# Patient Record
Sex: Male | Born: 1965
Health system: Southern US, Community
[De-identification: ages and names within clinical notes are randomized; demographics above are authoritative.]

## PROBLEM LIST (undated history)

## (undated) HISTORY — PX: CHOLECYSTECTOMY: SHX55

## (undated) HISTORY — PX: OTHER SURGICAL HISTORY: SHX169

## (undated) HISTORY — PX: TONSILLECTOMY: SUR1361

---

## 2018-08-31 ENCOUNTER — Emergency Department (HOSPITAL_COMMUNITY)
Admission: EM | Admit: 2018-08-31 | Discharge: 2018-08-31 | Disposition: A | Discharge status: Home / Self Care | Payer: PRIVATE HEALTH INSURANCE | Attending: Emergency Medicine | Admitting: Emergency Medicine

## 2018-08-31 ENCOUNTER — Emergency Department (HOSPITAL_COMMUNITY): Payer: PRIVATE HEALTH INSURANCE

## 2018-08-31 DIAGNOSIS — Y9389 Activity, other specified: Secondary | ICD-10-CM | POA: Insufficient documentation

## 2018-08-31 DIAGNOSIS — W108XXA Fall (on) (from) other stairs and steps, initial encounter: Secondary | ICD-10-CM | POA: Insufficient documentation

## 2018-08-31 DIAGNOSIS — Y929 Unspecified place or not applicable: Secondary | ICD-10-CM | POA: Insufficient documentation

## 2018-08-31 DIAGNOSIS — Y999 Unspecified external cause status: Secondary | ICD-10-CM | POA: Insufficient documentation

## 2018-08-31 DIAGNOSIS — W19XXXA Unspecified fall, initial encounter: Secondary | ICD-10-CM

## 2018-08-31 DIAGNOSIS — M25561 Pain in right knee: Secondary | ICD-10-CM | POA: Diagnosis not present

## 2018-08-31 MED ORDER — INDOMETHACIN 50 MG PO CAPS: 50.0000 mg | ORAL_CAPSULE | Freq: Every day | ORAL | 0 refills | Status: DC | PRN

## 2018-08-31 MED ORDER — IBUPROFEN 800 MG PO TABS
800.0000 mg | ORAL_TABLET | Freq: Once | ORAL | Status: AC
  Administered 2018-08-31: 800 mg via ORAL
  Filled 2018-08-31: qty 1

## 2018-08-31 NOTE — ED Provider Notes (Signed)
[facility] DEPT Provider Note   CSN: [id number] Arrival date & time: 08/31/18  0248     History   Chief Complaint Chief Complaint  Patient presents with  . Fall    HPI Ryan Collier is a 52 y.o. male presenting for evaluation after fall.  Patient states he was going down the stairs when he slipped, and his right leg got pinned between the walls and the stairs.  He reports acute onset of right knee pain.  He denies hitting his head or loss of consciousness.  He is not on blood thinners.  He denies injury elsewhere.  He reports pain is of the medial aspect of the right knee.  It is constant.  He tried to stand, but was unable to bear weight on the leg due to pain.  He denies numbness or tingling.  He denies headache, neck pain, back pain, chest pain, abdominal pain, loss of bowel or bladder control.  He denies hip or ankle pain.  He has no medical problems, takes no medications daily.  He has not had anything for pain including Tylenol or ibuprofen.  HPI  No past medical history on file.  There are no active problems to display for this patient.    Home Medications    Prior to Admission medications   Medication Sig Start Date End Date Taking? Authorizing Provider  indomethacin (INDOCIN) 50 MG capsule Take 1 capsule (50 mg total) by mouth daily as needed for mild pain or moderate pain. 08/31/18   Lora Glomski, PA-C    Family History No family history on file.  Social History Social History   Tobacco Use  . Smoking status: Not on file  Substance Use Topics  . Alcohol use: Not on file  . Drug use: Not on file     Allergies   Patient has no known allergies.   Review of Systems Review of Systems  Eyes: Negative for visual disturbance.  Cardiovascular: Negative for chest pain.  Gastrointestinal: Negative for abdominal pain.  Genitourinary:       No loss of bowel or bladder control  Musculoskeletal: Positive for arthralgias and  joint swelling. Negative for back pain and neck pain.  Skin: Negative for wound.  Neurological: Negative for numbness and headaches.  Hematological: Does not bruise/bleed easily.  Psychiatric/Behavioral: Negative for confusion.     Physical Exam Updated Vital Signs BP 125/68 (BP Location: Right Arm)   Pulse 66   Temp 98 F (36.7 C) (Oral)   Resp 20   SpO2 96%   Physical Exam  Constitutional: He is oriented to person, place, and time. He appears well-developed and well-nourished. No distress.  HENT:  Head: Normocephalic and atraumatic.  Eyes: EOM are normal.  Neck: Normal range of motion.  Pulmonary/Chest: Effort normal.  Abdominal: He exhibits no distension.  Musculoskeletal: He exhibits edema and tenderness.  Moderate swelling noted of the right knee.  Tenderness palpation along the medial joint line.  No tenderness palpation of the posterior lateral knee.  No tenderness palpation of the ankle or hip.  No calf or thigh tenderness.  Soft compartments.  Pedal pulses intact bilaterally.  Sensation intact bilaterally.  Neurological: He is alert and oriented to person, place, and time. No sensory deficit.  Skin: Skin is warm. Capillary refill takes less than 2 seconds. No rash noted.  Psychiatric: He has a normal mood and affect.  Nursing note and vitals reviewed.    ED Treatments / Results  Labs (all  labs ordered are listed, but only abnormal results are displayed) Labs Reviewed - No data to display  EKG None  Radiology Dg Knee Complete 4 Views Right  Result Date: 08/31/2018 CLINICAL DATA:  Right knee pain after fall EXAM: RIGHT KNEE - COMPLETE 4+ VIEW COMPARISON:  None. FINDINGS: Mild osteophyte formation at the medial aspect of the femorotibial joint. No fracture or dislocation. No knee effusion. Mild soft tissue swelling. IMPRESSION: No fracture or dislocation of the right knee. Electronically Signed   By: Ulyses Jarred M.D.   On: 08/31/2018 04:13     Procedures Procedures (including critical care time)  Medications Ordered in ED Medications  ibuprofen (ADVIL,MOTRIN) tablet 800 mg (800 mg Oral Given 08/31/18 0336)     Initial Impression / Assessment and Plan / ED Course  I have reviewed the triage vital signs and the nursing notes.  Pertinent labs & imaging results that were available during my care of the patient were reviewed by me and considered in my medical decision making (see chart for details).     Pt presenting for evaluation of R knee pain after a fall. physical exam shows pt with moderate swelling and TTP along medial knee. neurovascularly intact. Pt states he does not want narcotic pain medication at this time, will give dose of ibuprofen, xrays ordered.   Xrays viewed and interpreted by me, no fx or dislocation. discussed with pt. As pt has severe pain with weight-bearing, will place in knee immobilizer and give crutches. Discussed tx with ice, elevation and nsaids. Pt has been on indomethacin for chronic L knee pain, will refill rx. Pt to f/u with his ortho ([facility]) if pain does not improve. At this time, pt appears safe for d/c. Return precautions given. Pt states he understands and agrees to plan.    Final Clinical Impressions(s) / ED Diagnoses   Final diagnoses:  Fall, initial encounter  Acute pain of right knee    ED Discharge Orders         Ordered    indomethacin (INDOCIN) 50 MG capsule  Daily PRN     08/31/18 0424           Franchot Heidelberg, PA-C 29/02/11 1552    Delora Fuel, MD 06/13/22 [phone]

## 2018-08-31 NOTE — ED Triage Notes (Addendum)
Pt arrived with EMS from home c/o Fall. Per EMS patient accidentally slip in the stairs and hit his right leg againts the wall.. Pt complain of right knee pain 8/10. No further complain or injury reported.

## 2018-08-31 NOTE — ED Notes (Signed)
Bed: YY51 Expected date:  Expected time:  Means of arrival:  Comments: 52 yo M/Fall right knee

## 2018-08-31 NOTE — Discharge Instructions (Signed)
Take indomethacin daily as needed for pain.  Do not take other anti-inflammatories at the same time open (Advil, Motrin, naproxen, ibuprofen, Aleve). You may supplement with Tylenol if you need further pain control. Use ice packs, 20 minutes at a time, 3-4 times a day.  Keep your leg elevated when able.  Use the knee brace and crutches while walking.  Follow up with the orthopedic doc

## 2019-01-30 ENCOUNTER — Encounter: Payer: Self-pay | Admitting: Emergency Medicine

## 2019-01-30 ENCOUNTER — Emergency Department
Admission: EM | Admit: 2019-01-30 | Discharge: 2019-01-31 | Disposition: A | Discharge status: Home / Self Care | Payer: PRIVATE HEALTH INSURANCE | Attending: Emergency Medicine | Admitting: Emergency Medicine

## 2019-01-30 ENCOUNTER — Emergency Department: Payer: PRIVATE HEALTH INSURANCE

## 2019-01-30 ENCOUNTER — Other Ambulatory Visit: Payer: Self-pay

## 2019-01-30 DIAGNOSIS — R059 Cough, unspecified: Secondary | ICD-10-CM

## 2019-01-30 DIAGNOSIS — R197 Diarrhea, unspecified: Secondary | ICD-10-CM

## 2019-01-30 DIAGNOSIS — R112 Nausea with vomiting, unspecified: Secondary | ICD-10-CM | POA: Insufficient documentation

## 2019-01-30 DIAGNOSIS — R05 Cough: Secondary | ICD-10-CM | POA: Insufficient documentation

## 2019-01-30 LAB — CBC
HCT: 40.2 % (ref 39.0–52.0)
HEMOGLOBIN: 13.1 g/dL (ref 13.0–17.0)
MCH: 26.9 pg (ref 26.0–34.0)
MCHC: 32.6 g/dL (ref 30.0–36.0)
MCV: 82.5 fL (ref 80.0–100.0)
PLATELETS: 336 10*3/uL (ref 150–400)
RBC: 4.87 MIL/uL (ref 4.22–5.81)
RDW: 15 % (ref 11.5–15.5)
WBC: 13.3 10*3/uL — ABNORMAL HIGH (ref 4.0–10.5)
nRBC: 0 % (ref 0.0–0.2)

## 2019-01-30 LAB — COMPREHENSIVE METABOLIC PANEL
ALBUMIN: 3.7 g/dL (ref 3.5–5.0)
ALK PHOS: 67 U/L (ref 38–126)
ALT: 24 U/L (ref 0–44)
ANION GAP: 8 (ref 5–15)
AST: 23 U/L (ref 15–41)
BILIRUBIN TOTAL: 0.8 mg/dL (ref 0.3–1.2)
BUN: 17 mg/dL (ref 6–20)
CALCIUM: 8.4 mg/dL — AB (ref 8.9–10.3)
CO2: 22 mmol/L (ref 22–32)
Chloride: 106 mmol/L (ref 98–111)
Creatinine, Ser: 1 mg/dL (ref 0.61–1.24)
GFR calc non Af Amer: >60 mL/min (ref >60)
Glucose, Bld: 98 mg/dL (ref 70–99)
POTASSIUM: 3.6 mmol/L (ref 3.5–5.1)
SODIUM: 136 mmol/L (ref 135–145)
TOTAL PROTEIN: 7.3 g/dL (ref 6.5–8.1)

## 2019-01-30 LAB — LIPASE, BLOOD: Lipase: 28 U/L (ref 11–51)

## 2019-01-30 LAB — INFLUENZA PANEL BY PCR (TYPE A & B)
INFLAPCR: NEGATIVE
Influenza B By PCR: NEGATIVE

## 2019-01-30 LAB — TROPONIN I

## 2019-01-30 MED ORDER — SODIUM CHLORIDE 0.9 % IV BOLUS
1000.0000 mL | Freq: Once | INTRAVENOUS | Status: AC
  Administered 2019-01-30: 1000 mL via INTRAVENOUS

## 2019-01-30 MED ORDER — ONDANSETRON 4 MG PO TBDP: 4.0000 mg | ORAL_TABLET | Freq: Three times a day (TID) | ORAL | 0 refills | Status: DC | PRN

## 2019-01-30 MED ORDER — ONDANSETRON HCL 4 MG/2ML IJ SOLN
4.0000 mg | Freq: Once | INTRAMUSCULAR | Status: AC
  Administered 2019-01-30: 4 mg via INTRAVENOUS
  Filled 2019-01-30: qty 2

## 2019-01-30 MED ORDER — SODIUM CHLORIDE 0.9% FLUSH: 3.0000 mL | Freq: Once | INTRAVENOUS | Status: DC

## 2019-01-30 MED ORDER — LOPERAMIDE HCL 2 MG PO TABS: 2.0000 mg | ORAL_TABLET | Freq: Four times a day (QID) | ORAL | 0 refills | Status: DC | PRN

## 2019-01-30 NOTE — ED Notes (Signed)
Patient transported to X-ray 

## 2019-01-30 NOTE — ED Triage Notes (Signed)
Pt to ED via POV c/o generalized body aches, N/V/D x 2 days. Pt states that he has vomited 6 times in the last 24 hour and had 4 episodes of diarrhea. Pt is in NAD at this time.

## 2019-01-30 NOTE — ED Provider Notes (Signed)
[facility] Emergency Department Provider Note  ____________________________________________  Time seen: Approximately 10:25 PM  I have reviewed the triage vital signs and the nursing notes.   HISTORY  Chief Complaint Emesis and Generalized Body Aches    HPI Ryan Collier is a 53 y.o. male morbid obesity presenting with generalized weakness, nausea vomiting and diarrhea, and cough.  The patient reports that for the past several days, he has had 4-5 daily episodes of loose nonbloody stool without any associated abdominal pain.  He has also had some vomiting.  When he is at work, he feels weak and has to lean up against something.  He has had a mild nonproductive cough without any congestion or rhinorrhea, sore throat or ear pain.  He has not had any fevers or chills.  He has had some mild dysuria.  He has not had any known sick contacts.  He did travel to [place] biplane with a layover in [place] 2 weeks ago.  History reviewed. No pertinent past medical history.  There are no active problems to display for this patient.   Past Surgical History:  Procedure Laterality Date  . CHOLECYSTECTOMY    . knee surgery    . TONSILLECTOMY      Current Outpatient Rx  . Order #: [id number] Class: Normal  . Order #: [id number] Class: Print  . Order #: [id number] Class: Print    Allergies Patient has no known allergies.  No family history on file.  Social History Social History   Tobacco Use  . Smoking status: Never Smoker  . Smokeless tobacco: Never Used  Substance Use Topics  . Alcohol use: Not Currently  . Drug use: Not Currently    Review of Systems Constitutional: No fever/chills.  No lightheadedness or syncope.  Positive generalized weakness. Eyes: No visual changes. ENT: No sore throat. No congestion or rhinorrhea. Cardiovascular: Denies chest pain. Denies palpitations. Respiratory: Denies shortness of breath.  Positive cough. Gastrointestinal: No  abdominal pain.  Positive nausea, positive vomiting.  Positive diarrhea.  No constipation. Genitourinary: Positive for dysuria. Musculoskeletal: Negative for back pain. Skin: Negative for rash. Neurological: Negative for headaches. No focal numbness, tingling or weakness.     ____________________________________________   PHYSICAL EXAM:  VITAL SIGNS: ED Triage Vitals  Enc Vitals Group     BP 01/30/19 1758 136/76     Pulse Rate 01/30/19 1758 78     Resp 01/30/19 1758 16     Temp 01/30/19 1758 98.1 F (36.7 C)     Temp Source 01/30/19 1758 Oral     SpO2 01/30/19 1758 96 %     Weight 01/30/19 1759 (!) 310 lb (140.6 kg)     Height 01/30/19 1759 5\' 11"  (1.803 m)     Head Circumference --      Peak Flow --      Pain Score 01/30/19 1759 4     Pain Loc --      Pain Edu? --      Excl. in [place]? --     Constitutional: Alert and oriented. Answers questions appropriately. Eyes: Conjunctivae are normal.  EOMI. No scleral icterus. Head: Atraumatic. Nose: No congestion/rhinnorhea. Mouth/Throat: Mucous membranes are moist.  Neck: No stridor.  Supple.  No JVD.  No meningismus. Cardiovascular: Normal rate, regular rhythm. No murmurs, rubs or gallops.  Respiratory: Normal respiratory effort.  No accessory muscle use or retractions. Lungs CTAB.  No wheezes, rales or ronchi. Gastrointestinal: Obese.  Soft, nontender and nondistended.  No guarding or rebound.  No peritoneal signs. Musculoskeletal: No LE edema.  Neurologic:  A&Ox3.  Speech is clear.  Face and smile are symmetric.  EOMI.  Moves all extremities well. Skin:  Skin is warm, dry and intact. No rash noted. Psychiatric: Mood and affect are normal. Speech and behavior are normal.  Normal judgement.  ____________________________________________   LABS (all labs ordered are listed, but only abnormal results are displayed)  Labs Reviewed  COMPREHENSIVE METABOLIC PANEL - Abnormal; Notable for the following components:      Result  Value   Calcium 8.4 (*)    All other components within normal limits  CBC - Abnormal; Notable for the following components:   WBC 13.3 (*)    All other components within normal limits  C DIFFICILE QUICK SCREEN W PCR REFLEX  GASTROINTESTINAL PANEL BY PCR, STOOL (REPLACES STOOL CULTURE)  LIPASE, BLOOD  INFLUENZA PANEL BY PCR (TYPE A & B)  TROPONIN I  URINALYSIS, COMPLETE (UACMP) WITH MICROSCOPIC   ____________________________________________  EKG  ED ECG REPORT I, Anne-Caroline Mariea Clonts, the attending physician, personally viewed and interpreted this ECG.   Date: 01/30/2019  EKG Time: 2321  Rate: 66  Rhythm: normal sinus rhythm  Axis: normal  Intervals:none  ST&T Change: No STEMI  ____________________________________________  RADIOLOGY  Dg Chest 2 View  Result Date: 01/30/2019 CLINICAL DATA:  Generalized body aches, nausea vomiting. Weakness and cough. EXAM: CHEST - 2 VIEW COMPARISON:  None. FINDINGS: Cardiomediastinal silhouette is normal. Mediastinal contours appear intact. There is no evidence of focal airspace consolidation, pleural effusion or pneumothorax. Mild left lung base atelectasis versus scarring. Osseous structures are without acute abnormality. Soft tissues are grossly normal. IMPRESSION: No active cardiopulmonary disease. Electronically Signed   By: Fidela Salisbury M.D.   On: 01/30/2019 23:15    ____________________________________________   PROCEDURES  Procedure(s) performed: None  Procedures  Critical Care performed: No ____________________________________________   INITIAL IMPRESSION / ASSESSMENT AND PLAN / ED COURSE  Pertinent labs & imaging results that were available during my care of the patient were reviewed by me and considered in my medical decision making (see chart for details).  53 y.o. male with morbid obesity presenting with generalized weakness, nausea vomiting and diarrhea, mild cough and dysuria.  Overall the patient is  well-appearing with stable vital signs.  He is afebrile here.  His mucous membranes are moist and he appears well-hydrated.  Symptoms may be due to a viral GI illness.  Other etiologies include coronavirus, influenza, UTI.  At this time, the patient does not meet criteria for testing for coronavirus but I have talked him extensively about my recommendation for self isolation and quarantine for 14 days if he is able to be discharged home today.  Plan reevaluation for final disposition.  ----------------------------------------- 11:46 PM on 01/30/2019 -----------------------------------------  The patient's work-up in the emergency department is reassuring and he continues to have stable vital signs to be afebrile.  His laboratory studies are reassuring, his chest x-ray does not show any obvious abnormalities.  The patient was unable to give a stool sample.  At this time, the patient will be discharged home.  I will send him home with symptomatic treatment and I have given him the information about quarantine.  Follow-up instructions as well as return precautions were discussed.  ____________________________________________  FINAL CLINICAL IMPRESSION(S) / ED DIAGNOSES  Final diagnoses:  Nausea vomiting and diarrhea  Cough         NEW MEDICATIONS STARTED DURING THIS VISIT:  New Prescriptions  LOPERAMIDE (IMODIUM A-D) 2 MG TABLET    Take 1 tablet (2 mg total) by mouth 4 (four) times daily as needed for diarrhea or loose stools.   ONDANSETRON (ZOFRAN ODT) 4 MG DISINTEGRATING TABLET    Take 1 tablet (4 mg total) by mouth every 8 (eight) hours as needed.      Eula Listen, MD 01/30/19 2351

## 2019-01-30 NOTE — Discharge Instructions (Signed)
These take a clear liquid diet for the next 24 to 48 hours, then advance to a bland diet as tolerated.  You may take Zofran for nausea and loperamide for diarrhea.  Please quarantine yourself for 14 days; we do not know if you have coronavirus that is causing your symptoms today.  Isolating yourself will keep your community safer.  Return to the emergency department if you develop severe pain, lightheadedness or fainting, fever, or any other symptoms concerning to you.

## 2019-09-24 DIAGNOSIS — R300 Dysuria: Secondary | ICD-10-CM | POA: Diagnosis not present

## 2019-09-24 DIAGNOSIS — R319 Hematuria, unspecified: Secondary | ICD-10-CM | POA: Diagnosis not present

## 2019-09-24 DIAGNOSIS — G629 Polyneuropathy, unspecified: Secondary | ICD-10-CM | POA: Diagnosis not present

## 2019-09-30 ENCOUNTER — Encounter: Payer: Self-pay | Admitting: Family Medicine

## 2019-09-30 ENCOUNTER — Ambulatory Visit: Payer: Self-pay | Admitting: Family Medicine

## 2019-09-30 ENCOUNTER — Other Ambulatory Visit: Payer: Self-pay

## 2019-09-30 ENCOUNTER — Ambulatory Visit (INDEPENDENT_AMBULATORY_CARE_PROVIDER_SITE_OTHER): Payer: BC Managed Care – PPO | Admitting: Family Medicine

## 2019-09-30 VITALS — BP 118/80 | HR 83 | Temp 98.7°F | Ht 72.0 in | Wt 330.0 lb

## 2019-09-30 DIAGNOSIS — E559 Vitamin D deficiency, unspecified: Secondary | ICD-10-CM

## 2019-09-30 DIAGNOSIS — R7303 Prediabetes: Secondary | ICD-10-CM | POA: Diagnosis not present

## 2019-09-30 DIAGNOSIS — R202 Paresthesia of skin: Secondary | ICD-10-CM | POA: Diagnosis not present

## 2019-09-30 DIAGNOSIS — Z125 Encounter for screening for malignant neoplasm of prostate: Secondary | ICD-10-CM

## 2019-09-30 DIAGNOSIS — Z7689 Persons encountering health services in other specified circumstances: Secondary | ICD-10-CM | POA: Diagnosis not present

## 2019-09-30 DIAGNOSIS — R0789 Other chest pain: Secondary | ICD-10-CM

## 2019-09-30 DIAGNOSIS — Z6841 Body Mass Index (BMI) 40.0 and over, adult: Secondary | ICD-10-CM

## 2019-09-30 LAB — COMPREHENSIVE METABOLIC PANEL
ALT: 19 U/L (ref 0–53)
AST: 17 U/L (ref 0–37)
Albumin: 4.2 g/dL (ref 3.5–5.2)
Alkaline Phosphatase: 78 U/L (ref 39–117)
BUN: 17 mg/dL (ref 6–23)
CO2: 29 mEq/L (ref 19–32)
Calcium: 9.5 mg/dL (ref 8.4–10.5)
Chloride: 103 mEq/L (ref 96–112)
Creatinine, Ser: 1.03 mg/dL (ref 0.40–1.50)
GFR: 90.78 mL/min (ref >60.00)
Glucose, Bld: 98 mg/dL (ref 70–99)
Potassium: 4.3 mEq/L (ref 3.5–5.1)
Sodium: 138 mEq/L (ref 135–145)
Total Bilirubin: 0.6 mg/dL (ref 0.2–1.2)
Total Protein: 7.4 g/dL (ref 6.0–8.3)

## 2019-09-30 LAB — SEDIMENTATION RATE: Sed Rate: 41 mm/hr — ABNORMAL HIGH (ref 0–20)

## 2019-09-30 LAB — HEMOGLOBIN A1C: Hgb A1c MFr Bld: 6.1 % (ref 4.6–6.5)

## 2019-09-30 LAB — PSA: PSA: 0.85 ng/mL (ref 0.10–4.00)

## 2019-09-30 LAB — LIPID PANEL
Cholesterol: 207 mg/dL — ABNORMAL HIGH (ref 0–200)
HDL: 43.2 mg/dL (ref >39.00)
LDL Cholesterol: 143 mg/dL — ABNORMAL HIGH (ref 0–99)
NonHDL: 163.98
Total CHOL/HDL Ratio: 5
Triglycerides: 103 mg/dL (ref 0.0–149.0)
VLDL: 20.6 mg/dL (ref 0.0–40.0)

## 2019-09-30 LAB — VITAMIN B12: Vitamin B-12: 297 pg/mL (ref 211–911)

## 2019-09-30 LAB — TSH: TSH: 1.23 u[IU]/mL (ref 0.35–4.50)

## 2019-09-30 LAB — VITAMIN D 25 HYDROXY (VIT D DEFICIENCY, FRACTURES): VITD: 17.83 ng/mL — ABNORMAL LOW (ref 30.00–100.00)

## 2019-09-30 MED ORDER — VITAMIN D3 1.25 MG (50000 UT) PO TABS: 1.0000 | ORAL_TABLET | ORAL | 1 refills | Status: AC

## 2019-09-30 NOTE — Patient Instructions (Signed)
Good to see you today  Please schedule your complete physical for 3-4 months  Keep working on your weight loss, continue to cut out soda, walk as much as you can.   I have put in a referral for you to see the neurologist for nerve conduction studies. The results will help [place] to determine next steps.   If you develop any additional urinary symptoms, please let me know

## 2019-09-30 NOTE — Progress Notes (Signed)
Subjective:    Patient ID: Ryan Collier, male    DOB: Oct 09, 1966, 53 y.o.   MRN: [id number]  HPI This is a 53 yo male who presents today to establish care. Moved from [place]. Has 4 daughters. 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 Works a lot, 5-6 days a week.   Last CPE- 2018 PSA- 07/08/2017- normal Colonoscopy- 2018 Tdap- per patient, 2018 Flu- declines Dental- not regular Eye- not regular Exercise- none Diet- has recently cut our sodas, from 2-4 a day.   Right leg burning from knee to ankle. Constant sensation, "not pain." For several years. Was given gabapentin 300 mg, took one dose which made him very dizzy. Notices it most when he is sitting at work or driving.   Burning with urination, on Cipro from Urgent Care for urgency and frequency, hematuria (gross). Symptoms improving. On second day of cipro. Denies nausea, lower abdominal or perineal pain. No history of prostatitis.   Chest pain- once a week, sharp pain on left side of chest, lasts 5 seconds or last. No pain with activity.   No past medical history on file. Past Surgical History:  Procedure Laterality Date  . CHOLECYSTECTOMY    . knee surgery    . TONSILLECTOMY     History reviewed. No pertinent family history. Social History   Tobacco Use  . Smoking status: Never Smoker  . Smokeless tobacco: Never Used  Substance Use Topics  . Alcohol use: Not Currently  . Drug use: Not Currently     Review of Systems  Constitutional: Negative.   Respiratory: Positive for wheezing (with activity).   Cardiovascular: Positive for chest pain (occasional left sided, lasts 5 seconds).  Gastrointestinal: Negative for abdominal distention and abdominal pain.  Genitourinary: Positive for hematuria (last week, see HPI). Negative for difficulty urinating, dysuria and flank pain.  Skin: Negative.   Psychiatric/Behavioral: Positive for sleep disturbance  (inadequate sleep).       Objective:   Physical Exam Constitutional:      General: He is not in acute distress.    Appearance: Normal appearance. He is obese. He is not ill-appearing, toxic-appearing or diaphoretic.  HENT:     Head: Normocephalic and atraumatic.  Eyes:     Conjunctiva/sclera: Conjunctivae normal.  Cardiovascular:     Rate and Rhythm: Normal rate and regular rhythm.     Heart sounds: Normal heart sounds.  Pulmonary:     Effort: Pulmonary effort is normal.     Breath sounds: Normal breath sounds.  Musculoskeletal:     Right lower leg: No edema.     Left lower leg: No edema.  Skin:    General: Skin is warm and dry.  Neurological:     Mental Status: He is alert and oriented to person, place, and time.  Psychiatric:        Mood and Affect: Mood normal.        Behavior: Behavior normal.        Thought Content: Thought content normal.        Judgment: Judgment normal.       BP 118/80 (BP Location: Left Arm, Patient Position: Sitting, Cuff Size: Large)   Pulse 83   Temp 98.7 F (37.1 C) (Temporal)   Ht 6' (1.829 m)   Wt (!) 330 lb (149.7 kg)   SpO2 96%   BMI 44.76 kg/m  Wt Readings from Last 3 Encounters:  09/30/19 Marland Kitchen)  330 lb (149.7 kg)  01/30/19 (!) 310 lb (140.6 kg)   EKG- NSR, Rate 74, PR 188, QT 404    Assessment & Plan:  1. Encounter to establish care - will review available records - follow up in 3-4 months for CPE, sooner if necessitated by labs  2. Prediabetes - encouraged him to continue to work on weight loss with increased walking and healthy food choices - Lipid Panel - Hemoglobin A1c - Comprehensive metabolic panel - TSH  3. BMI 40.0-44.9, adult ([place]) - see #2 - Lipid Panel - Hemoglobin A1c - Comprehensive metabolic panel - TSH - Vitamin D, 25-hydroxy  4. Paresthesia of right leg - longstanding,  - Sed Rate (ESR) - B12 - Ambulatory referral to Neurology  5. Screening for prostate cancer - PSA  6. Other chest pain -  EKG 12-Lead   Clarene Reamer, FNP-BC  [facility], [place]  09/30/2019 1:33 PM

## 2019-09-30 NOTE — Progress Notes (Signed)
New Patient Office Visit  Subjective:  Patient ID: Ryan Collier, male    DOB: [birth year]  Age: 53 y.o. MRN: [id number]  CC:  Chief Complaint  Patient presents with  . New Patient (Initial Visit)    Burning sensation in right leg - knee to ankle x 3 months. Denies swelling in foot/ankle. Seen by UC and they told the patient that his leg was swollen and warm to touch    HPI Arlon Bleier is 53 yo presents to establish primary care. Pt moves from [place] about a year ago. Pt is married and has 4 girls. Pt works as a Educational psychologist for 53 yo.  Burning sensation from knee down to ankle for the past 2-3 months. Constant pain. Pt has not tried to take anything for pain due to the quality of pain.   Burning and blood in the urine . Pt is on second day of Cipro.   STD panel was negative from Urgent care   Last CPE-2018 PSA-2018 Colonoscopy-2018 Tdap-2018 Flu-declined Dental-none Eye-none Exercise-nothing Diet-cutting sodas   No past medical history on file.  Past Surgical History:  Procedure Laterality Date  . CHOLECYSTECTOMY    . knee surgery    . TONSILLECTOMY      History reviewed. No pertinent family history.  Social History   Socioeconomic History  . Marital status: Married    Spouse name: Not on file  . Number of children: Not on file  . Years of education: Not on file  . Highest education level: Not on file  Occupational History  . Not on file  Social Needs  . Financial resource strain: Not on file  . Food insecurity    Worry: Not on file    Inability: Not on file  . Transportation needs    Medical: Not on file    Non-medical: Not on file  Tobacco Use  . Smoking status: Never Smoker  . Smokeless tobacco: Never Used  Substance and Sexual Activity  . Alcohol use: Not Currently  . Drug use: Not Currently  . Sexual activity: Not on file  Lifestyle  . Physical activity    Days per week: Not on file    Minutes per session: Not on file  . Stress: Not on  file  Relationships  . Social Herbalist on phone: Not on file    Gets together: Not on file    Attends religious service: Not on file    Active member of club or organization: Not on file    Attends meetings of clubs or organizations: Not on file    Relationship status: Not on file  . Intimate partner violence    Fear of current or ex partner: Not on file    Emotionally abused: Not on file    Physically abused: Not on file    Forced sexual activity: Not on file  Other Topics Concern  . Not on file  Social History Narrative  . Not on file    ROS Review of Systems  Constitutional: Negative for chills, fatigue and fever.  HENT: Negative for ear pain and rhinorrhea.   Respiratory: Positive for wheezing. Negative for shortness of breath.        Wheezing with activities  Cardiovascular: Positive for chest pain. Negative for palpitations.       Happens once a week and last for 5 sec  Gastrointestinal: Negative for abdominal distention, abdominal pain, diarrhea, nausea and vomiting.  Genitourinary: Positive for frequency  and urgency. Negative for hematuria.  Musculoskeletal:       Right leg pain  Skin: Negative.   Neurological: Negative for headaches.    Objective:   Today's Vitals: BP 118/80 (BP Location: Left Arm, Patient Position: Sitting, Cuff Size: Large)   Pulse 83   Temp 98.7 F (37.1 C) (Temporal)   Ht 6' (1.829 m)   Wt (!) 149.7 kg   SpO2 96%   BMI 44.76 kg/m    Wt Readings from Last 3 Encounters:  09/30/19 (!) 149.7 kg  01/30/19 (!) 140.6 kg    Physical Exam Neck:     Musculoskeletal: Normal range of motion.  Cardiovascular:     Rate and Rhythm: Normal rate and regular rhythm.     Pulses: Normal pulses.     Heart sounds: Normal heart sounds.  Pulmonary:     Effort: Pulmonary effort is normal.     Breath sounds: Normal breath sounds.  Abdominal:     General: Bowel sounds are normal.     Palpations: Abdomen is soft.  Musculoskeletal:         General: No swelling, deformity or signs of injury.  Skin:    General: Skin is warm and dry.  Neurological:     General: No focal deficit present.     Mental Status: He is oriented to person, place, and time. Mental status is at baseline.  Psychiatric:        Mood and Affect: Mood normal.     Assessment & Plan:  1. Prediabetes - Lipid Panel - Hemoglobin A1c - Comprehensive metabolic panel - TSH  2. BMI 40.0-44.9, adult ([facility]) - Lipid Panel - Hemoglobin A1c - Comprehensive metabolic panel - TSH - Vitamin D, 25-hydroxy  3. Paresthesia of right leg - Sed Rate (ESR) - B12 - Ambulatory referral to Neurology  4. Screening for prostate cancer - PSA  5. Other chest pain Stable condition, normal EKG - EKG 12-Lead Problem List Items Addressed This Visit    None      Outpatient Encounter Medications as of 09/30/2019  Medication Sig  . [DISCONTINUED] indomethacin (INDOCIN) 50 MG capsule Take 1 capsule (50 mg total) by mouth daily as needed for mild pain or moderate pain.  . [DISCONTINUED] loperamide (IMODIUM A-D) 2 MG tablet Take 1 tablet (2 mg total) by mouth 4 (four) times daily as needed for diarrhea or loose stools.  . [DISCONTINUED] ondansetron (ZOFRAN ODT) 4 MG disintegrating tablet Take 1 tablet (4 mg total) by mouth every 8 (eight) hours as needed.   No facility-administered encounter medications on file as of 09/30/2019.     Follow-up: No follow-ups on file.   Rica Koyanagi, RN

## 2019-09-30 NOTE — Addendum Note (Signed)
Addended by: Clarene Reamer B on: 09/30/2019 03:49 PM   Modules accepted: Orders

## 2019-10-09 ENCOUNTER — Encounter: Payer: Self-pay | Admitting: Family Medicine

## 2019-10-10 ENCOUNTER — Ambulatory Visit (HOSPITAL_COMMUNITY)
Admission: EM | Admit: 2019-10-10 | Discharge: 2019-10-10 | Disposition: A | Discharge status: Home / Self Care | Payer: BC Managed Care – PPO | Attending: Family Medicine | Admitting: Family Medicine

## 2019-10-10 ENCOUNTER — Ambulatory Visit (INDEPENDENT_AMBULATORY_CARE_PROVIDER_SITE_OTHER)
Admission: RE | Admit: 2019-10-10 | Discharge: 2019-10-10 | Disposition: A | Discharge status: Home / Self Care | Payer: BC Managed Care – PPO | Source: Ambulatory Visit

## 2019-10-10 ENCOUNTER — Other Ambulatory Visit: Payer: Self-pay

## 2019-10-10 ENCOUNTER — Encounter (HOSPITAL_COMMUNITY): Payer: Self-pay

## 2019-10-10 DIAGNOSIS — R509 Fever, unspecified: Secondary | ICD-10-CM | POA: Diagnosis not present

## 2019-10-10 DIAGNOSIS — R197 Diarrhea, unspecified: Secondary | ICD-10-CM | POA: Insufficient documentation

## 2019-10-10 DIAGNOSIS — R05 Cough: Secondary | ICD-10-CM | POA: Insufficient documentation

## 2019-10-10 DIAGNOSIS — Z20828 Contact with and (suspected) exposure to other viral communicable diseases: Secondary | ICD-10-CM | POA: Diagnosis not present

## 2019-10-10 DIAGNOSIS — J069 Acute upper respiratory infection, unspecified: Secondary | ICD-10-CM

## 2019-10-10 DIAGNOSIS — J029 Acute pharyngitis, unspecified: Secondary | ICD-10-CM | POA: Diagnosis not present

## 2019-10-10 DIAGNOSIS — R059 Cough, unspecified: Secondary | ICD-10-CM

## 2019-10-10 LAB — POC SARS CORONAVIRUS 2 AG: SARS Coronavirus 2 Ag: NEGATIVE

## 2019-10-10 LAB — POC SARS CORONAVIRUS 2 AG -  ED: SARS Coronavirus 2 Ag: NEGATIVE

## 2019-10-10 MED ORDER — BENZONATATE 100 MG PO CAPS: 100.0000 mg | ORAL_CAPSULE | Freq: Three times a day (TID) | ORAL | 0 refills | Status: DC

## 2019-10-10 MED ORDER — ALBUTEROL SULFATE HFA 108 (90 BASE) MCG/ACT IN AERS: 1.0000 | INHALATION_SPRAY | Freq: Four times a day (QID) | RESPIRATORY_TRACT | 0 refills | Status: AC | PRN

## 2019-10-10 NOTE — ED Provider Notes (Signed)
Virtual Visit via Video Note:  Ryan Collier  initiated request for Telemedicine visit with [facility]. I connected with Ryan Collier  on 10/10/2019 at 11:15 AM  for a synchronized telemedicine visit using a video enabled HIPPA compliant telemedicine application. I verified that I am speaking with Ryan Collier  using two identifiers. Ryan Gottron, NP  was physically located in a [name] site and [facility] was located at a different location.   The limitations of evaluation and management by telemedicine as well as the availability of in-person appointments were discussed. Patient was informed that he  may incur a bill ( including co-pay) for this virtual visit encounter. Ryan Collier  expressed understanding and gave verbal consent to proceed with virtual visit.     History of Present Illness:Ryan Collier  is a 53 y.o. male presents with complaints of cough. Productive of yellow mucus. Temp of 99.3, was 100.2 around 0400 this am. Took theraflu around 8p last night which did seem to help. Cough has improved some. Symptoms started 11/25. No worsening of symptoms. Headache, this has improved, starting yesterday. No body aches. Mild runny nose. No ear pain. Sore throat due to coughing. No shortness of breath . Occasional wheezing. No chest pain . No known ill contacts. Works as a Educational psychologist, no others ill as far as he knows, however. States he is in contact with many people daily. Has had some diarrhea. No loss of taste or smell. nyquil a few nights ago, didn't seem to help. Denies any previous similar. Has gotten a flu vaccine. No asthma or copd. Doesn't smoke.   No past medical history on file.  No Known Allergies      Observations/Objective: Alert, oriented, non toxic in appearance. Clear coherent speech without difficulty. No increased work of breathing visualized.  Dry cough noted intermittently.  Assessment and Plan: Patient will present to the urgent care  clinic for rapid covid-19 testing. Follow up send-out testing if this is negative. We will provide work note for him pending his rapid testing. 4 days of symptoms, fever controlled, no chest pain , no shortness of breath . Remains likely viral. Supportive cares recommended.strict in person precautions provided. Patient verbalized understanding and agreeable to plan.    Follow Up Instructions:    I discussed the assessment and treatment plan with the patient. The patient was provided an opportunity to ask questions and all were answered. The patient agreed with the plan and demonstrated an understanding of the instructions.   The patient was advised to call back or seek an in-person evaluation if the symptoms worsen or if the condition fails to improve as anticipated.  I provided 15 minutes of non-face-to-face time during this encounter.    Ryan Gottron, NP  10/10/2019 11:15 AM          Ryan Gottron, NP 10/10/19 1117

## 2019-10-10 NOTE — Discharge Instructions (Signed)
Please come to either of our [facility] to get your covid-19 testing. We will perform a rapid test, if this is negative we will perform a send out test.  We will provide you with a work note at that time.  Still lily viral at this time. Push fluids to ensure adequate hydration and keep secretions thin.  Tylenol and/or ibuprofen as needed for pain or fevers.  Use of inhaler as needed for wheezing or shortness of breath.   Tessalon Perles as needed for cough.  Over the counter  medications as needed for cough.  If your symptoms worsen at all- increased fevers, you develop chest pain  or shortness of breath- or if it does not improve in the next week please seek in person evaluation

## 2019-10-10 NOTE — ED Triage Notes (Addendum)
Pt present sore throat, productive cough and diarrhea. Symptoms started 3 days ago. Pt has a virtue visit with Lanelle Bal today. He is here for a nurse visit

## 2019-10-12 ENCOUNTER — Other Ambulatory Visit: Payer: Self-pay | Admitting: Family Medicine

## 2019-10-12 DIAGNOSIS — R300 Dysuria: Secondary | ICD-10-CM

## 2019-10-12 LAB — NOVEL CORONAVIRUS, NAA (HOSP ORDER, SEND-OUT TO REF LAB; TAT 18-24 HRS): SARS-CoV-2, NAA: NOT DETECTED

## 2019-10-14 ENCOUNTER — Encounter: Payer: Self-pay | Admitting: Family Medicine

## 2019-10-14 NOTE — Telephone Encounter (Signed)
Ryan Collier, urology appointment scheduled with Dr Bernardo Heater on 11/26/2019

## 2019-10-26 ENCOUNTER — Other Ambulatory Visit: Payer: Self-pay | Admitting: Family Medicine

## 2019-10-26 ENCOUNTER — Encounter: Payer: Self-pay | Admitting: Family Medicine

## 2019-10-26 DIAGNOSIS — Z113 Encounter for screening for infections with a predominantly sexual mode of transmission: Secondary | ICD-10-CM

## 2019-10-26 DIAGNOSIS — R300 Dysuria: Secondary | ICD-10-CM

## 2019-10-26 NOTE — Telephone Encounter (Signed)
Please advise, thanks.

## 2019-10-27 ENCOUNTER — Other Ambulatory Visit (INDEPENDENT_AMBULATORY_CARE_PROVIDER_SITE_OTHER): Payer: BC Managed Care – PPO

## 2019-10-27 DIAGNOSIS — R300 Dysuria: Secondary | ICD-10-CM | POA: Diagnosis not present

## 2019-10-27 DIAGNOSIS — E559 Vitamin D deficiency, unspecified: Secondary | ICD-10-CM | POA: Diagnosis not present

## 2019-10-27 DIAGNOSIS — A599 Trichomoniasis, unspecified: Secondary | ICD-10-CM

## 2019-10-27 DIAGNOSIS — Z113 Encounter for screening for infections with a predominantly sexual mode of transmission: Secondary | ICD-10-CM | POA: Diagnosis not present

## 2019-10-27 LAB — VITAMIN D 25 HYDROXY (VIT D DEFICIENCY, FRACTURES): VITD: 22.5 ng/mL — ABNORMAL LOW (ref 30.00–100.00)

## 2019-10-28 ENCOUNTER — Telehealth: Payer: Self-pay | Admitting: Family Medicine

## 2019-10-28 LAB — HIV ANTIBODY (ROUTINE TESTING W REFLEX): HIV 1&2 Ab, 4th Generation: NONREACTIVE

## 2019-10-28 LAB — TRICHOMONAS VAGINALIS RNA, QL,MALES: Trichomonas vaginalis RNA: DETECTED — AB

## 2019-10-28 LAB — RPR: RPR Ser Ql: NONREACTIVE

## 2019-10-28 LAB — C. TRACHOMATIS/N. GONORRHOEAE RNA
C. trachomatis RNA, TMA: NOT DETECTED
N. gonorrhoeae RNA, TMA: NOT DETECTED

## 2019-10-28 MED ORDER — METRONIDAZOLE 500 MG PO TABS: 500.0000 mg | ORAL_TABLET | Freq: Two times a day (BID) | ORAL | 0 refills | Status: AC

## 2019-10-28 NOTE — Telephone Encounter (Signed)
Patient called and would like to [facility] [address] [facility] [place]. Not [facility] the [facility].

## 2019-10-28 NOTE — Addendum Note (Signed)
Addended by: Clarene Reamer B on: 10/28/2019 01:44 PM   Modules accepted: Orders

## 2019-10-29 NOTE — Telephone Encounter (Signed)
Pharmacy already update to this. No further action needed

## 2019-11-11 ENCOUNTER — Encounter: Payer: Self-pay | Admitting: Family Medicine

## 2019-11-11 ENCOUNTER — Other Ambulatory Visit: Payer: Self-pay | Admitting: Family Medicine

## 2019-11-11 DIAGNOSIS — R202 Paresthesia of skin: Secondary | ICD-10-CM

## 2019-11-16 ENCOUNTER — Encounter: Payer: Self-pay | Admitting: Family Medicine

## 2019-11-16 ENCOUNTER — Encounter: Payer: Self-pay | Admitting: *Deleted

## 2019-11-16 ENCOUNTER — Ambulatory Visit
Admission: EM | Admit: 2019-11-16 | Discharge: 2019-11-16 | Disposition: A | Discharge status: Home / Self Care | Payer: BC Managed Care – PPO | Attending: Emergency Medicine | Admitting: Emergency Medicine

## 2019-11-16 ENCOUNTER — Ambulatory Visit (INDEPENDENT_AMBULATORY_CARE_PROVIDER_SITE_OTHER): Payer: BC Managed Care – PPO | Admitting: Family Medicine

## 2019-11-16 VITALS — Temp 98.8°F | Ht 72.0 in | Wt 338.0 lb

## 2019-11-16 DIAGNOSIS — R05 Cough: Secondary | ICD-10-CM | POA: Diagnosis not present

## 2019-11-16 DIAGNOSIS — R062 Wheezing: Secondary | ICD-10-CM | POA: Diagnosis not present

## 2019-11-16 DIAGNOSIS — J22 Unspecified acute lower respiratory infection: Secondary | ICD-10-CM | POA: Diagnosis not present

## 2019-11-16 DIAGNOSIS — R058 Other specified cough: Secondary | ICD-10-CM

## 2019-11-16 DIAGNOSIS — Z20822 Contact with and (suspected) exposure to covid-19: Secondary | ICD-10-CM | POA: Diagnosis not present

## 2019-11-16 DIAGNOSIS — J029 Acute pharyngitis, unspecified: Secondary | ICD-10-CM | POA: Diagnosis not present

## 2019-11-16 DIAGNOSIS — R059 Cough, unspecified: Secondary | ICD-10-CM

## 2019-11-16 MED ORDER — BENZONATATE 100 MG PO CAPS: 100.0000 mg | ORAL_CAPSULE | Freq: Three times a day (TID) | ORAL | 1 refills | Status: AC

## 2019-11-16 MED ORDER — HYDROCODONE-HOMATROPINE 5-1.5 MG/5ML PO SYRP: 5.0000 mL | ORAL_SOLUTION | Freq: Three times a day (TID) | ORAL | 0 refills | Status: DC | PRN

## 2019-11-16 MED ORDER — AZITHROMYCIN 250 MG PO TABS: ORAL_TABLET | ORAL | 0 refills | Status: AC

## 2019-11-16 NOTE — Telephone Encounter (Signed)
Pt being seen today at 11am via [facility]

## 2019-11-16 NOTE — ED Triage Notes (Signed)
Pt rx'd Z-Pack, albuterol, tessalon, hycodan, and vitamin D

## 2019-11-16 NOTE — Discharge Instructions (Addendum)
Your COVID test is pending.  You should self quarantine until your test result is back and is negative.   ° °Go to the emergency department if you develop high fever, shortness of breath, severe diarrhea, or other concerning symptoms.   ° °

## 2019-11-16 NOTE — Progress Notes (Signed)
Virtual Visit via Video Note  I connected with Ryan Collier on 11/16/19 at 11:00 AM EST by a video enabled telemedicine application and verified that I am speaking with the correct person using two identifiers.  Location: Patient: at his home Provider: [place] Persons participating in virtual visit: Patient and provider   I discussed the limitations of evaluation and management by telemedicine and the availability of in person appointments. The patient expressed understanding and agreed to proceed.  History of Present Illness: Chief Complaint  Patient presents with  . Cough    x 3 weeks. Pt c/o congestion, sore throat and rib pain. Pt had low grade temp yesterday 99.5. Pt also notes wheezing. Pt was taking Robitussin for cough and this has not worked. Took [employer] and those did not help either. Cough is dry and hacky majority of the time but will get up a little yellow mucous    This is a 55 yo male who presents today for virtual visit for the above concerns.  The patient was seen virtually in urgent care 10/10/2019 with URI symptoms.  Times.  He had negative rapid and PCR Covid testing at that time.  He reports that his symptoms have never completely resolved.  Currently he has a dry, hacking, nearly constant cough.  He has had a low-grade fever.  He has rib pain with coughing.  He denies any sinus symptoms, nasal drainage, sore throat, ear pain.  He is noticed intermittent wheezing.  He endorses shortness of breath with walking which is unchanged.  He has been using Robitussin-DM and was using [employer] until he ran out.  Robitussin does not seem to help but the Tessalon helped a little.  He was also given an albuterol inhaler that he is using twice a day with some temporary relief.  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   No past medical history on file. Past Surgical History:  Procedure Laterality Date  . CHOLECYSTECTOMY    . knee surgery     . TONSILLECTOMY     No family history on file. Social History   Tobacco Use  . Smoking status: Never Smoker  . Smokeless tobacco: Never Used  Substance Use Topics  . Alcohol use: Not Currently  . Drug use: Not Currently      Observations/Objective: Patient is alert and answers questions appropriately.  He has a near constant dry hacking cough.  There is no increased work of breathing.  No audible wheeze.  Respirations are even and unlabored.  Mood and affect are appropriate  Temp 98.8 F (37.1 C) (Oral)   Ht 6' (1.829 m)   Wt (!) 338 lb (153.3 kg)   BMI 45.84 kg/m  Wt Readings from Last 3 Encounters:  11/16/19 (!) 338 lb (153.3 kg)  09/30/19 (!) 330 lb (149.7 kg)  01/30/19 (!) 310 lb (140.6 kg)    Assessment and Plan: 1. Lower resp. tract infection -Ahead and cover him for atypical pneumonia given persistence of symptoms. -He was instructed to schedule Covid testing as soon as possible.  He was instructed to quarantine until results are returned.  Work note provided -Continue symptomatic treatment, Mucinex, albuterol as needed, will add antibiotic -ER cautions reviewed -Follow-up in 2 to 3 days if no improvement - azithromycin (ZITHROMAX) 250 MG tablet; Take 2 tabs PO x 1 dose, then 1 tab PO QD x 4 days  Dispense: 6 tablet; Refill: 0  2. Cough - HYDROcodone-homatropine (HYCODAN)  5-1.5 MG/5ML syrup; Take 5 mLs by mouth every 8 (eight) hours as needed for cough.  Dispense: 120 mL; Refill: 0 - benzonatate (TESSALON) 100 MG capsule; Take 1-2 capsules (100-200 mg total) by mouth every 8 (eight) hours.  Dispense: 30 capsule; Refill: Schlusser, FNP-BC  [facility]  11/16/2019 11:15 AM   Follow Up Instructions:    I discussed the assessment and treatment plan with the patient. The patient was provided an opportunity to ask questions and all were answered. The patient agreed with the plan and demonstrated an  understanding of the instructions.   The patient was advised to call back or seek an in-person evaluation if the symptoms worsen or if the condition fails to improve as anticipated.    Elby Beck, FNP

## 2019-11-16 NOTE — ED Provider Notes (Signed)
[facility]    CSN: [id number] Arrival date & time: 11/16/19  1211      History   Chief Complaint Chief Complaint  Patient presents with  . Cough    HPI Ryan Collier is a 54 y.o. male.   Patient presents with request for a COVID test.  He was seen by his PCP this morning and instructed to come here for the COVID test; he was diagnosed by his PCP with a lower respiratory tract infection and treated with Zithromax, Mucinex, albuterol, Hycodan, Tessalon Perles.  Patient reports nonproductive cough and congestion x >1 month.  He reports a low-grade temperature of 99.5 yesterday, sore throat, earache, and wheezing.  He denies other symptoms, including vomiting, diarrhea, or rash.  Patient reports exposure to COVID at work.  The history is provided by the patient.    History reviewed. No pertinent past medical history.  Patient Active Problem List   Diagnosis Date Noted  . Prediabetes 09/30/2019  . BMI 40.0-44.9, adult ([place]) 09/30/2019  . Paresthesia of right leg 09/30/2019    Past Surgical History:  Procedure Laterality Date  . CHOLECYSTECTOMY    . knee surgery    . TONSILLECTOMY         Home Medications    Prior to Admission medications   Medication Sig Start Date End Date Taking? Authorizing Provider  albuterol (PROAIR HFA) 108 (90 Base) MCG/ACT inhaler Inhale 1-2 puffs into the lungs every 6 (six) hours as needed for wheezing or shortness of breath. 10/10/19   [name], NP  azithromycin (ZITHROMAX) 250 MG tablet Take 2 tabs PO x 1 dose, then 1 tab PO QD x 4 days 11/16/19   Elby Beck, FNP  benzonatate (TESSALON) 100 MG capsule Take 1-2 capsules (100-200 mg total) by mouth every 8 (eight) hours. 11/16/19   Elby Beck, FNP  Cholecalciferol (VITAMIN D3) 1.25 MG (50000 UT) TABS Take 1 tablet by mouth every 7 (seven) days. 09/30/19   Elby Beck, FNP  HYDROcodone-homatropine (HYCODAN) 5-1.5 MG/5ML syrup Take 5 mLs by mouth every 8 (eight)  hours as needed for cough. 11/16/19   Elby Beck, FNP    Family History History reviewed. No pertinent family history.  Social History Social History   Tobacco Use  . Smoking status: Never Smoker  . Smokeless tobacco: Never Used  Substance Use Topics  . Alcohol use: Not Currently  . Drug use: Not Currently     Allergies   Patient has no known allergies.   Review of Systems Review of Systems  Constitutional: Positive for fever. Negative for chills.  HENT: Positive for congestion, ear pain and sore throat.   Eyes: Negative for pain and visual disturbance.  Respiratory: Positive for cough and wheezing. Negative for shortness of breath.   Cardiovascular: Negative for chest pain and palpitations.  Gastrointestinal: Negative for abdominal pain, diarrhea, nausea and vomiting.  Genitourinary: Negative for dysuria and hematuria.  Musculoskeletal: Negative for arthralgias and back pain.  Skin: Negative for color change and rash.  Neurological: Negative for seizures and syncope.  All other systems reviewed and are negative.    Physical Exam Triage Vital Signs ED Triage Vitals  Enc Vitals Group     BP 11/16/19 1213 127/87     Pulse Rate 11/16/19 1213 96     Resp 11/16/19 1213 18     Temp 11/16/19 1213 98.8 F (37.1 C)     Temp Source 11/16/19 1213 Oral  SpO2 11/16/19 1213 94 %     Weight --      Height --      Head Circumference --      Peak Flow --      Pain Score 11/16/19 1219 3     Pain Loc --      Pain Edu? --      Excl. in [place]? --    No data found.  Updated Vital Signs BP 127/87 (BP Location: Left Arm)   Pulse 96   Temp 98.8 F (37.1 C) (Oral)   Resp 18   SpO2 94%   Visual Acuity Right Eye Distance:   Left Eye Distance:   Bilateral Distance:    Right Eye Near:   Left Eye Near:    Bilateral Near:     Physical Exam Vitals and nursing note reviewed.  Constitutional:      General: He is not in acute distress.    Appearance: He is  well-developed. He is not ill-appearing.  HENT:     Head: Normocephalic and atraumatic.     Right Ear: Tympanic membrane normal.     Left Ear: Tympanic membrane normal.     Nose: Nose normal.     Mouth/Throat:     Mouth: Mucous membranes are moist.     Pharynx: Oropharynx is clear.  Eyes:     Conjunctiva/sclera: Conjunctivae normal.  Cardiovascular:     Rate and Rhythm: Normal rate and regular rhythm.     Heart sounds: No murmur.  Pulmonary:     Effort: Pulmonary effort is normal. No respiratory distress.     Breath sounds: Normal breath sounds.  Abdominal:     General: Bowel sounds are normal.     Palpations: Abdomen is soft.     Tenderness: There is no abdominal tenderness. There is no guarding or rebound.  Musculoskeletal:     Cervical back: Neck supple.  Skin:    General: Skin is warm and dry.     Findings: No rash.  Neurological:     General: No focal deficit present.     Mental Status: He is alert and oriented to person, place, and time.  Psychiatric:        Mood and Affect: Mood normal.        Behavior: Behavior normal.      UC Treatments / Results  Labs (all labs ordered are listed, but only abnormal results are displayed) Labs Reviewed  NOVEL CORONAVIRUS, NAA    EKG   Radiology No results found.  Procedures Procedures (including critical care time)  Medications Ordered in UC Medications - No data to display  Initial Impression / Assessment and Plan / UC Course  I have reviewed the triage vital signs and the nursing notes.  Pertinent labs & imaging results that were available during my care of the patient were reviewed by me and considered in my medical decision making (see chart for details).    Cough with exposure to COVID.  Instructed patient to continue medications as directed by his PCP.  COVID test performed here.  Instructed patient to self quarantine until the test result is back.  Instructed patient to go to the emergency department if he  develops high fever, shortness of breath, severe diarrhea, or other concerning symptoms.  Patient agrees with plan of care.     Final Clinical Impressions(s) / UC Diagnoses   Final diagnoses:  Cough with exposure to COVID-19 virus     Discharge Instructions  Your COVID test is pending.  You should self quarantine until your test result is back and is negative.    Go to the emergency department if you develop high fever, shortness of breath, severe diarrhea, or other concerning symptoms.       ED Prescriptions    None     PDMP not reviewed this encounter.   Sharion Balloon, NP 11/16/19 1302

## 2019-11-16 NOTE — ED Triage Notes (Signed)
Patient was recently diagnosed with PNA by PCP, patient is rx'd antibiotics will start today. PCP wanted patient tested for COVID.   Patient reports cough started 3 weeks ago, fever yesterday (100.1) with cough, and generally not feeling well.

## 2019-11-17 LAB — NOVEL CORONAVIRUS, NAA: SARS-CoV-2, NAA: NOT DETECTED

## 2019-11-25 ENCOUNTER — Encounter: Payer: BC Managed Care – PPO | Admitting: Neurology

## 2019-11-26 ENCOUNTER — Ambulatory Visit: Payer: BC Managed Care – PPO | Admitting: Urology

## 2019-11-26 ENCOUNTER — Encounter: Payer: Self-pay | Admitting: Urology

## 2020-01-01 ENCOUNTER — Encounter: Payer: BC Managed Care – PPO | Admitting: Family Medicine

## 2020-01-03 ENCOUNTER — Other Ambulatory Visit: Payer: Self-pay

## 2020-01-03 ENCOUNTER — Encounter: Payer: Self-pay | Admitting: Emergency Medicine

## 2020-01-03 ENCOUNTER — Emergency Department
Admission: EM | Admit: 2020-01-03 | Discharge: 2020-01-03 | Disposition: A | Discharge status: Home / Self Care | Payer: BC Managed Care – PPO | Attending: Student | Admitting: Student

## 2020-01-03 ENCOUNTER — Emergency Department: Payer: BC Managed Care – PPO

## 2020-01-03 DIAGNOSIS — I77810 Thoracic aortic ectasia: Secondary | ICD-10-CM | POA: Diagnosis not present

## 2020-01-03 DIAGNOSIS — Z79899 Other long term (current) drug therapy: Secondary | ICD-10-CM | POA: Insufficient documentation

## 2020-01-03 DIAGNOSIS — M546 Pain in thoracic spine: Secondary | ICD-10-CM | POA: Insufficient documentation

## 2020-01-03 DIAGNOSIS — M549 Dorsalgia, unspecified: Secondary | ICD-10-CM

## 2020-01-03 LAB — BASIC METABOLIC PANEL
Anion gap: 9 (ref 5–15)
BUN: 15 mg/dL (ref 6–20)
CO2: 26 mmol/L (ref 22–32)
Calcium: 9.2 mg/dL (ref 8.9–10.3)
Chloride: 106 mmol/L (ref 98–111)
Creatinine, Ser: 0.93 mg/dL (ref 0.61–1.24)
GFR calc Af Amer: >60 mL/min (ref >60)
GFR calc non Af Amer: >60 mL/min (ref >60)
Glucose, Bld: 102 mg/dL — ABNORMAL HIGH (ref 70–99)
Potassium: 4.4 mmol/L (ref 3.5–5.1)
Sodium: 141 mmol/L (ref 135–145)

## 2020-01-03 LAB — CBC
HCT: 43.2 % (ref 39.0–52.0)
Hemoglobin: 13.9 g/dL (ref 13.0–17.0)
MCH: 26.4 pg (ref 26.0–34.0)
MCHC: 32.2 g/dL (ref 30.0–36.0)
MCV: 82 fL (ref 80.0–100.0)
Platelets: 347 10*3/uL (ref 150–400)
RBC: 5.27 MIL/uL (ref 4.22–5.81)
RDW: 15.6 % — ABNORMAL HIGH (ref 11.5–15.5)
WBC: 12.2 10*3/uL — ABNORMAL HIGH (ref 4.0–10.5)
nRBC: 0 % (ref 0.0–0.2)

## 2020-01-03 LAB — FIBRIN DERIVATIVES D-DIMER (ARMC ONLY): Fibrin derivatives D-dimer (ARMC): 604.88 ng/mL (FEU) — ABNORMAL HIGH (ref 0.00–499.00)

## 2020-01-03 MED ORDER — IOHEXOL 350 MG/ML SOLN
100.0000 mL | Freq: Once | INTRAVENOUS | Status: AC | PRN
  Administered 2020-01-03: 100 mL via INTRAVENOUS
  Filled 2020-01-03: qty 100

## 2020-01-03 MED ORDER — CYCLOBENZAPRINE HCL 5 MG PO TABS: ORAL_TABLET | ORAL | 0 refills | Status: AC

## 2020-01-03 NOTE — ED Provider Notes (Signed)
[facility] Emergency Department Provider Note  ____________________________________________  Time seen: Approximately 10:18 AM  I have reviewed the triage vital signs and the nursing notes.   HISTORY  Chief Complaint Back Pain    HPI Ryan Collier is a 54 y.o. male that presents to the emergency department for evaluation of left-sided neck pain and left-sided upper back pain for 5 days.  Patient states that initially, pain was worse with coughing.  Now pain is constant.  Patient denies shortness of breath but his wife states that he is breathing harder than normal.  Patient is unable to fully turn his neck to the left due to pain.  He is having difficulty sleeping due to the pain.  Patient had pneumonia about 2 months ago but this resolved.  He has an occasional nonproductive cough.  No recent travel or surgery.  He does not smoke.  No leg swelling.  No contacts with COVID-19.  No fevers, shortness of breath, chest pain, nausea, vomiting, abdominal pain.   History reviewed. No pertinent past medical history.  Patient Active Problem List   Diagnosis Date Noted  . Prediabetes 09/30/2019  . BMI 40.0-44.9, adult ([place]) 09/30/2019  . Paresthesia of right leg 09/30/2019    Past Surgical History:  Procedure Laterality Date  . CHOLECYSTECTOMY    . knee surgery    . TONSILLECTOMY      Prior to Admission medications   Medication Sig Start Date End Date Taking? Authorizing Provider  albuterol (PROAIR HFA) 108 (90 Base) MCG/ACT inhaler Inhale 1-2 puffs into the lungs every 6 (six) hours as needed for wheezing or shortness of breath. 10/10/19   [name], NP  azithromycin (ZITHROMAX) 250 MG tablet Take 2 tabs PO x 1 dose, then 1 tab PO QD x 4 days 11/16/19   Elby Beck, FNP  benzonatate (TESSALON) 100 MG capsule Take 1-2 capsules (100-200 mg total) by mouth every 8 (eight) hours. 11/16/19   Elby Beck, FNP  Cholecalciferol (VITAMIN D3) 1.25 MG (50000  UT) TABS Take 1 tablet by mouth every 7 (seven) days. 09/30/19   Elby Beck, FNP  cyclobenzaprine (FLEXERIL) 5 MG tablet Take 1-2 tablets 3 times daily as needed 01/03/20   Laban Emperor, PA-C  HYDROcodone-homatropine [facility]) 5-1.5 MG/5ML syrup Take 5 mLs by mouth every 8 (eight) hours as needed for cough. 11/16/19   Elby Beck, FNP    Allergies Patient has no known allergies.  No family history on file.  Social History Social History   Tobacco Use  . Smoking status: Never Smoker  . Smokeless tobacco: Never Used  Substance Use Topics  . Alcohol use: Not Currently  . Drug use: Not Currently     Review of Systems  Constitutional: No fever/chills ENT: No upper respiratory complaints. Cardiovascular: No chest pain. Respiratory: Positive for occasional nonproductive cough.  No SOB. Gastrointestinal: No abdominal pain.  No nausea, no vomiting.  Musculoskeletal: Positive for mid and upper back pain. Skin: Negative for rash, abrasions, lacerations, ecchymosis. Neurological: Negative for headaches, numbness or tingling   ____________________________________________   PHYSICAL EXAM:  VITAL SIGNS: ED Triage Vitals [01/03/20 0959]  Enc Vitals Group     BP (!) 143/85     Pulse Rate 88     Resp 16     Temp 98.2 F (36.8 C)     Temp Source Oral     SpO2 96 %     Weight (!) 330 lb (149.7 kg)  Height 5\' 11"  (1.803 m)     Head Circumference      Peak Flow      Pain Score 7     Pain Loc      Pain Edu?      Excl. in [place]?      Constitutional: Alert and oriented. Well appearing and in no acute distress. Eyes: Conjunctivae are normal. PERRL. EOMI. Head: Atraumatic. ENT:      Ears:      Nose: No congestion/rhinnorhea.      Mouth/Throat: Mucous membranes are moist.  Neck: No stridor. Cardiovascular: Normal rate, regular rhythm.  Good peripheral circulation. Respiratory: Normal respiratory effort without tachypnea or retractions. Lungs CTAB. Good air entry  to the bases with no decreased or absent breath sounds. Gastrointestinal: Bowel sounds 4 quadrants. Soft and nontender to palpation. No guarding or rigidity. No palpable masses. No distention.  Musculoskeletal: Full range of motion to all extremities. No gross deformities appreciated.  No tenderness to palpation to thoracic or lumbar spine.  No tenderness to palpation throughout upper back.  No pain elicited with range of motion of spine.  No pain elicited with deep breathing or coughing. Neurologic:  Normal speech and language. No gross focal neurologic deficits are appreciated.  Skin:  Skin is warm, dry and intact. No rash noted. Psychiatric: Mood and affect are normal. Speech and behavior are normal. Patient exhibits appropriate insight and judgement.   ____________________________________________   LABS (all labs ordered are listed, but only abnormal results are displayed)  Labs Reviewed  CBC - Abnormal; Notable for the following components:      Result Value   WBC 12.2 (*)    RDW 15.6 (*)    All other components within normal limits  BASIC METABOLIC PANEL - Abnormal; Notable for the following components:   Glucose, Bld 102 (*)    All other components within normal limits  FIBRIN DERIVATIVES D-DIMER ([facility] ONLY) - Abnormal; Notable for the following components:   Fibrin derivatives D-dimer [facility]) 604.88 (*)    All other components within normal limits   ____________________________________________  EKG   ____________________________________________  RADIOLOGY Robinette Haines, personally viewed and evaluated these images (plain radiographs) as part of my medical decision making, as well as reviewing the written report by the radiologist.  DG Chest 2 View  Result Date: 01/03/2020 CLINICAL DATA:  Left-sided back pain 5 days. EXAM: CHEST - 2 VIEW COMPARISON:  01/30/2019 FINDINGS: Lungs are adequately inflated and otherwise clear. Cardiomediastinal silhouette and remainder of  the exam is unchanged. IMPRESSION: No active cardiopulmonary disease. Electronically Signed   By: Marin Olp M.D.   On: 01/03/2020 11:00   CT Angio Chest PE W and/or Wo Contrast  Result Date: 01/03/2020 CLINICAL DATA:  Mid back pain 5 days. EXAM: CT ANGIOGRAPHY CHEST WITH CONTRAST TECHNIQUE: Multidetector CT imaging of the chest was performed using the standard protocol during bolus administration of intravenous contrast. Multiplanar CT image reconstructions and MIPs were obtained to evaluate the vascular anatomy. CONTRAST:  167mL OMNIPAQUE IOHEXOL 350 MG/ML SOLN COMPARISON:  Chest x-ray today. FINDINGS: Cardiovascular: Heart is normal size. Mild ectasia of the ascending thoracic aorta measuring 3.5 cm in AP diameter. No evidence of aortic dissection. Pulmonary arterial system is well opacified without evidence of emboli. Mediastinum/Nodes: No evidence of mediastinal or hilar adenopathy. Remaining mediastinal structures are unremarkable. Lungs/Pleura: Lungs are adequately inflated without airspace consolidation or effusion. Minimal linear atelectasis over the lingula. Airways are normal. Upper Abdomen: Previous cholecystectomy. No  acute findings in the visualized upper abdomen. Musculoskeletal: Unremarkable. Review of the MIP images confirms the above findings. IMPRESSION: 1. No evidence of pulmonary embolism and no evidence of acute cardiopulmonary disease. 2. Mild ectasia of the ascending thoracic aorta measuring 3.5 cm. Recommend annual imaging followup by CTA or MRA. This recommendation follows 2010 ACCF/AHA/AATS/ACR/ASA/SCA/SCAI/SIR/STS/SVM Guidelines for the Diagnosis and Management of Patients with Thoracic Aortic Disease. Circulation.2010; 121ML:4928372. Aortic aneurysm NOS (ICD10-I71.9). Electronically Signed   By: Marin Olp M.D.   On: 01/03/2020 13:43    ____________________________________________    PROCEDURES  Procedure(s) performed:    Procedures    Medications  iohexol  (OMNIPAQUE) 350 MG/ML injection 100 mL (100 mLs Intravenous Contrast Given 01/03/20 1327)     ____________________________________________   INITIAL IMPRESSION / ASSESSMENT AND PLAN / ED COURSE  Pertinent labs & imaging results that were available during my care of the patient were reviewed by me and considered in my medical decision making (see chart for details).  Review of the [place] CSRS was performed in accordance of the [facility] prior to dispensing any controlled drugs.   Patient presented to the emergency department for evaluation of left upper back pain for 5 days.  Vital signs and exam are reassuring.  Chest x-ray negative for acute cardiopulmonary processes.  EKG shows normal sinus rhythm.  D-dimer and basic labs were ordered to further evaluate.  D-dimer mildly elevated at 604.  CT angio was ordered to further evaluate.  CT angio negative for PE but does show a mild ectasia of the thoracic aorta.  Findings were discussed with the patient and he will follow up with primary care for recheck and future CT scans.  Pain was not initially reproducible but following images and lab work, patient states he now can feel a knot to his upper back where most of his pain is localized.  Patient may have a muscle strain.  Patient will be discharged home with prescriptions for Flexeril. Patient is to follow up with primary care as directed. Patient is given ED precautions to return to the ED for any worsening or new symptoms.  Ryan Collier was evaluated in Emergency Department on 01/03/2020 for the symptoms described in the history of present illness. He was evaluated in the context of the global COVID-19 pandemic, which necessitated consideration that the patient might be at risk for infection with the SARS-CoV-2 virus that causes COVID-19. Institutional protocols and algorithms that pertain to the evaluation of patients at risk for COVID-19 are in a state of rapid change based on information released by regulatory  bodies including the CDC and federal and state organizations. These policies and algorithms were followed during the patient's care in the ED.   ____________________________________________  FINAL CLINICAL IMPRESSION(S) / ED DIAGNOSES  Final diagnoses:  Upper back pain  Thoracic aortic ectasia (HCC)      NEW MEDICATIONS STARTED DURING THIS VISIT:  ED Discharge Orders         Ordered    cyclobenzaprine (FLEXERIL) 5 MG tablet     01/03/20 1415              This chart was dictated using voice recognition software/Dragon. Despite best efforts to proofread, errors can occur which can change the meaning. Any change was purely unintentional.    Laban Emperor, PA-C 01/03/20 1515    Joan Mayans Damaris Hippo., MD 01/04/20 [phone]

## 2020-01-03 NOTE — ED Triage Notes (Signed)
Pt to ED via POV c/o back pain x 5 days. PT has used OTC medication without relief. Pt is in NAD.

## 2020-01-08 DIAGNOSIS — M5412 Radiculopathy, cervical region: Secondary | ICD-10-CM | POA: Diagnosis not present

## 2020-01-08 DIAGNOSIS — M25512 Pain in left shoulder: Secondary | ICD-10-CM | POA: Diagnosis not present

## 2020-01-11 ENCOUNTER — Encounter: Payer: Self-pay | Admitting: Family Medicine

## 2020-01-11 NOTE — Telephone Encounter (Signed)
Pt seen in ED 01/03/20, had labs done and is wanting to know the results. Specifically asking about the Fibrin derivatives D-dimer  Please advise, thanks.

## 2020-01-15 ENCOUNTER — Encounter: Payer: Self-pay | Admitting: Family Medicine

## 2020-01-15 ENCOUNTER — Ambulatory Visit (INDEPENDENT_AMBULATORY_CARE_PROVIDER_SITE_OTHER): Payer: BC Managed Care – PPO | Admitting: Family Medicine

## 2020-01-15 ENCOUNTER — Other Ambulatory Visit: Payer: Self-pay

## 2020-01-15 VITALS — BP 118/68 | HR 105 | Temp 98.7°F | Ht 72.0 in | Wt 341.8 lb

## 2020-01-15 DIAGNOSIS — R7303 Prediabetes: Secondary | ICD-10-CM

## 2020-01-15 DIAGNOSIS — Z0000 Encounter for general adult medical examination without abnormal findings: Secondary | ICD-10-CM

## 2020-01-15 DIAGNOSIS — M79602 Pain in left arm: Secondary | ICD-10-CM

## 2020-01-15 DIAGNOSIS — D1722 Benign lipomatous neoplasm of skin and subcutaneous tissue of left arm: Secondary | ICD-10-CM | POA: Diagnosis not present

## 2020-01-15 LAB — POCT GLYCOSYLATED HEMOGLOBIN (HGB A1C): Hemoglobin A1C: 5.8 % — AB (ref 4.0–5.6)

## 2020-01-15 MED ORDER — DICLOFENAC SODIUM 75 MG PO TBEC: 75.0000 mg | DELAYED_RELEASE_TABLET | Freq: Two times a day (BID) | ORAL | 0 refills | Status: AC | PRN

## 2020-01-15 NOTE — Patient Instructions (Signed)
Follow up in 6 months  A resource that I like is www.dietdoctor.com/diabetes/diet  Here are some guidelines to help you with meal planning -  Avoid all processed and packaged foods (bread, pasta, crackers, chips, etc) and beverages containing calories.  Avoid added sugars and excessive natural sugars.  Attention to how you feel if you consume artificial sweeteners.  Do they make you more hungry or raise your blood sugar?  With every meal and snack, aim to get 20 g of protein (3 ounces of meat, 4 ounces of fish, 3 eggs, protein powder, 1 cup [place] yogurt, 1 cup cottage cheese, etc.)  Increase fiber in the form of non-starchy vegetables.  These help you feel full with very little carbohydrates and are good for gut health.  Eat 1 serving healthy carb per meal- 1/2 cup brown rice, beans, potato, corn- pay attention to whether or not this significantly raises your blood sugar. If it does, reduce the frequency you consume these.   Eat 2-3 servings of lower sugar fruits daily.  This includes berries, apples, oranges, peaches, pears, one half banana.  Have small amounts of good fats such as avocado, nuts, olive oil, nut butters, olives.  Add a little cheese to your salads to make them tasty.    Health Maintenance, Male Adopting a healthy lifestyle and getting preventive care are important in promoting health and wellness. Ask your health care provider about:  The right schedule for you to have regular tests and exams.  Things you can do on your own to prevent diseases and keep yourself healthy. What should I know about diet, weight, and exercise? Eat a healthy diet   Eat a diet that includes plenty of vegetables, fruits, low-fat dairy products, and lean protein.  Do not eat a lot of foods that are high in solid fats, added sugars, or sodium. Maintain a healthy weight Body mass index (BMI) is a measurement that can be used to identify possible weight problems. It estimates body fat based on  height and weight. Your health care provider can help determine your BMI and help you achieve or maintain a healthy weight. Get regular exercise Get regular exercise. This is one of the most important things you can do for your health. Most adults should:  Exercise for at least 150 minutes each week. The exercise should increase your heart rate and make you sweat (moderate-intensity exercise).  Do strengthening exercises at least twice a week. This is in addition to the moderate-intensity exercise.  Spend less time sitting. Even light physical activity can be beneficial. Watch cholesterol and blood lipids Have your blood tested for lipids and cholesterol at 55 years of age, then have this test every 5 years. You may need to have your cholesterol levels checked more often if:  Your lipid or cholesterol levels are high.  You are older than 55 years of age.  You are at high risk for heart disease. What should I know about cancer screening? Many types of cancers can be detected early and may often be prevented. Depending on your health history and family history, you may need to have cancer screening at various ages. This may include screening for:  Colorectal cancer.  Prostate cancer.  Skin cancer.  Lung cancer. What should I know about heart disease, diabetes, and high blood pressure? Blood pressure and heart disease  High blood pressure causes heart disease and increases the risk of stroke. This is more likely to develop in people who have high blood  pressure readings, are of African descent, or are overweight.  Talk with your health care provider about your target blood pressure readings.  Have your blood pressure checked: ? Every 3-5 years if you are 47-4 years of age. ? Every year if you are 79 years old or older.  If you are between the ages of 42 and 92 and are a current or former smoker, ask your health care provider if you should have a one-time screening for abdominal  aortic aneurysm (AAA). Diabetes Have regular diabetes screenings. This checks your fasting blood sugar level. Have the screening done:  Once every three years after age 9 if you are at a normal weight and have a low risk for diabetes.  More often and at a younger age if you are overweight or have a high risk for diabetes. What should I know about preventing infection? Hepatitis B If you have a higher risk for hepatitis B, you should be screened for this virus. Talk with your health care provider to find out if you are at risk for hepatitis B infection. Hepatitis C Blood testing is recommended for:  Everyone born from [birth year] through 1966.  Anyone with known risk factors for hepatitis C. Sexually transmitted infections (STIs)  You should be screened each year for STIs, including gonorrhea and chlamydia, if: ? You are sexually active and are younger than 55 years of age. ? You are older than 55 years of age and your health care provider tells you that you are at risk for this type of infection. ? Your sexual activity has changed since you were last screened, and you are at increased risk for chlamydia or gonorrhea. Ask your health care provider if you are at risk.  Ask your health care provider about whether you are at high risk for HIV. Your health care provider may recommend a prescription medicine to help prevent HIV infection. If you choose to take medicine to prevent HIV, you should first get tested for HIV. You should then be tested every 3 months for as long as you are taking the medicine. Follow these instructions at home: Lifestyle  Do not use any products that contain nicotine or tobacco, such as cigarettes, e-cigarettes, and chewing tobacco. If you need help quitting, ask your health care provider.  Do not use street drugs.  Do not share needles.  Ask your health care provider for help if you need support or information about quitting drugs. Alcohol use  Do not drink  alcohol if your health care provider tells you not to drink.  If you drink alcohol: ? Limit how much you have to 0-2 drinks a day. ? Be aware of how much alcohol is in your drink. In the [place] one drink equals one 12 oz bottle of beer (355 mL), one 5 oz glass of wine (148 mL), or one 1 oz glass of hard liquor (44 mL). General instructions  Schedule regular health, dental, and eye exams.  Stay current with your vaccines.  Tell your health care provider if: ? You often feel depressed. ? You have ever been abused or do not feel safe at home. Summary  Adopting a healthy lifestyle and getting preventive care are important in promoting health and wellness.  Follow your health care provider's instructions about healthy diet, exercising, and getting tested or screened for diseases.  Follow your health care provider's instructions on monitoring your cholesterol and blood pressure. This information is not intended to replace advice given to you by  your health care provider. Make sure you discuss any questions you have with your health care provider. Document Revised: 10/22/2018 Document Reviewed: 10/22/2018 Elsevier Patient Education  2020 [employer].

## 2020-01-15 NOTE — Progress Notes (Signed)
.    Subjective:    Patient ID: Ryan Collier, male    DOB: July 27, 1966, 54 y.o.   MRN: [id number]  HPI  Patient here for a physical and prediabetes follow up.  He has complaints of pain in his left shoulder that has radiated to his left lateral forearm.  Patient states that nothing has made his pain better and states that his pain is a 10/10.  Tried Flexeril that ortho prescribed, and he stated that it did not help.  Patient states that he has been eating one meal a day, usually [employer] fried chicken.  He has cut soda out of his diet for two months, but has increased his juice intake.  He states that he has not been exercising as much as he should.   Chief Complaint  Patient presents with  . Annual Exam    Pain in left arm, started in shoulder, now only in forearm. Does not recal injuring. Seen in ED almost 2 weeks ago and pt scheduled with Ortho, given Flexeril after xrays came back normal.      Review of Systems  Constitutional: Negative.   HENT: Negative.   Eyes: Negative.   Respiratory: Negative.   Gastrointestinal: Negative.  Negative for diarrhea.  Genitourinary: Negative.   Musculoskeletal: Positive for joint pain.  Skin:       Two 7 mm lipomas that were rubbery, non-fixed, mobile on left proximal forearm on brachioradialis        Objective:   Physical Exam HENT:     Head: Normocephalic.     Mouth/Throat:     Mouth: Mucous membranes are dry.  Cardiovascular:     Rate and Rhythm: Tachycardia present.     Heart sounds: Normal heart sounds.  Abdominal:     General: Bowel sounds are normal.  Musculoskeletal:        General: Normal range of motion.     Cervical back: Normal range of motion.  Skin:    General: Skin is warm and dry.  Neurological:     Mental Status: He is alert.  Psychiatric:        Behavior: Behavior normal.     BP 118/68 (BP Location: Right Arm, Patient Position: Sitting, Cuff Size: Large) Comment: taken in Rt arm since left is hurting  Pulse (!) 105    Temp 98.7 F (37.1 C) (Temporal)   Ht 6' (1.829 m)   Wt (!) 341 lb 12.8 oz (155 kg)   SpO2 96%   BMI 46.36 kg/m   Review of Systems  Constitutional: Negative.   HENT: Negative.   Eyes: Negative.   Respiratory: Negative.   Gastrointestinal: Negative.  Negative for diarrhea.  Genitourinary: Negative.   Musculoskeletal: Positive for joint pain.  Skin:       Two 7 mm lipomas that were rubbery, non-fixed, mobile on left proximal forearm on brachioradialis          Assessment & Plan:  Annual physical exam  Prediabetes - Plan: HgB A1c  History of trichomoniasis - Plan: CANCELED: Trichomonas vaginalis, RNA  Left arm pain - Plan: diclofenac (VOLTAREN) 75 MG EC tablet  Lipoma of left upper extremity  Morbidly obese (HCC)

## 2020-01-15 NOTE — Progress Notes (Signed)
Subjective:    Patient ID: Ryan Collier, male    DOB: [birth year], 54 y.o.   MRN: [id number]  HPI Chief Complaint  Patient presents with  . Annual Exam    Pain in left arm, started in shoulder, now only in forearm. Does not recal injuring. Seen in ED almost 2 weeks ago and pt scheduled with Ortho, given Flexeril after xrays came back normal.     Last CPE- 2018 PSA-07/09/2017 Colonoscopy- 11/12/2016 Tdap- ? 2018 Flu- declines Dental- not regular Exercise- none outside of work Diet- poor, on the days he works, he eats one meal a day, [employer] fried chicken. On his days off, has 3 meals- eggs, bacon, waffles, grits, etc. Has stopped drinking soda but drinks juice more than he drinks water.   Dysuria- resolved with treating trich.   Review of Systems  Constitutional: Negative.   HENT: Negative.   Eyes: Negative.   Respiratory: Negative.   Cardiovascular: Negative.   Gastrointestinal: Negative.   Endocrine: Negative.   Genitourinary: Negative.   Musculoskeletal:       Left forearm pain x several weeks. Saw ortho for same sided shoulder pain. Had xray- arthritis. No relief with cyclobenzaprine. Pain 10/10 constant, no weakness.   Skin: Negative.   Allergic/Immunologic: Negative.   Neurological: Positive for numbness (right lower leg, chronic, has appointment with neurology next week).  Psychiatric/Behavioral: Negative for dysphoric mood. The patient is not nervous/anxious.        Objective:   Physical Exam Constitutional:      Appearance: Normal appearance. He is obese.  HENT:     Head: Normocephalic and atraumatic.     Right Ear: External ear normal.     Left Ear: External ear normal.  Eyes:     Conjunctiva/sclera: Conjunctivae normal.  Cardiovascular:     Rate and Rhythm: Normal rate and regular rhythm.     Heart sounds: Normal heart sounds.  Pulmonary:     Effort: Pulmonary effort is normal.     Breath sounds: Normal breath sounds.  Musculoskeletal:     Cervical back:  Normal range of motion and neck supple.     Right lower leg: Edema (trace) present.     Left lower leg: Edema (trace) present.     Comments: Left arm with full ROM, no tenderness of muscles/ joint spaces. Normal strength, temperature, color.    Skin:    General: Skin is warm and dry.       Neurological:     Mental Status: He is alert and oriented to person, place, and time.  Psychiatric:        Mood and Affect: Mood normal.        Behavior: Behavior normal.        Thought Content: Thought content normal.        Judgment: Judgment normal.       BP 118/68 (BP Location: Right Arm, Patient Position: Sitting, Cuff Size: Large) Comment: taken in Rt arm since left is hurting  Pulse (!) 105   Temp 98.7 F (37.1 C) (Temporal)   Ht 6' (1.829 m)   Wt (!) 341 lb 12.8 oz (155 kg)   SpO2 96%   BMI 46.36 kg/m  Wt Readings from Last 3 Encounters:  01/15/20 (!) 341 lb 12.8 oz (155 kg)  01/03/20 (!) 330 lb (149.7 kg)  11/16/19 (!) 338 lb (153.3 kg)   Depression screen [facility] 2/9 01/15/2020 09/30/2019  Decreased Interest 0 0  Down, Depressed, Hopeless 0 0  PHQ - 2 Score 0 0       Assessment & Plan:  1. Annual physical exam - Discussed and encouraged healthy lifestyle choices- adequate sleep, regular exercise, stress management and healthy food choices.    2. Prediabetes - HgB A1c  3. Left arm pain - unclear etiology, will try prn diclofenac tabs/ gel - follow up if worsening or no improvement - diclofenac (VOLTAREN) 75 MG EC tablet; Take 1 tablet (75 mg total) by mouth 2 (two) times daily as needed.  Dispense: 30 tablet; Refill: 0  4. Lipoma of left upper extremity - continue to monitor  5. Morbidly obese ([place]) - discussed diet, provided written information - encouraged him to decrease juice intake, increase vegetables, fruits  This visit occurred during the SARS-CoV-2 public health emergency.  Safety protocols were in place, including screening questions prior to the visit,  additional usage of staff PPE, and extensive cleaning of exam room while observing appropriate contact time as indicated for disinfecting solutions.      Clarene Reamer, FNP-BC  [facility]  01/15/2020 9:31 AM

## 2020-01-18 DIAGNOSIS — Z23 Encounter for immunization: Secondary | ICD-10-CM | POA: Diagnosis not present

## 2020-01-19 DIAGNOSIS — R29898 Other symptoms and signs involving the musculoskeletal system: Secondary | ICD-10-CM | POA: Diagnosis not present

## 2020-01-25 ENCOUNTER — Telehealth: Payer: Self-pay | Admitting: Family Medicine

## 2020-01-25 NOTE — Telephone Encounter (Signed)
Message left for patient to return my call.  

## 2020-01-25 NOTE — Telephone Encounter (Signed)
Please call patient and let him know that I have not received his records from neurology. Is there something he needs regarding his appointment through our office?

## 2020-01-25 NOTE — Telephone Encounter (Signed)
Pt called to see if you have received his records from Neurology. He is requesting a call back to confirm

## 2020-01-25 NOTE — Telephone Encounter (Signed)
Patient contacted the office and states he was just wondering if we received their records, as they ran lots of tests and he was wanting Debbie's opinion on the results of these tests, because he is still having lots of pain & weakness in his legs - and he does not know what more he can do.  He states he will wait for [place] to get these results.

## 2020-01-26 NOTE — Telephone Encounter (Signed)
Debbie have you receive any records on this patient? I do not see any this morning in the office

## 2020-01-27 NOTE — Telephone Encounter (Signed)
Please call patient and let him know that I received the results of his nerve conduction tests which were normal. That is the only information I have gotten.

## 2020-01-27 NOTE — Telephone Encounter (Signed)
Please call patient let him know his nerve conduction test did not show any problems with the way the nerves are talking to each other down his leg.  The next step would be an MRI of his back.  A couple of options include having him see one of the neurologists and/or restarting him on gabapentin at a much lower dose to see if he will tolerate it better.

## 2020-01-27 NOTE — Telephone Encounter (Signed)
Patient advised. Patient said that was the only test they did. He does not have a follow up scheduled with a neurologist about this. Patient would like to know what to do now. He is still having the burning sensation in his right leg. The severity depends on the day. Not really changed overall.

## 2020-01-28 ENCOUNTER — Other Ambulatory Visit: Payer: Self-pay | Admitting: Family Medicine

## 2020-01-28 ENCOUNTER — Encounter: Payer: Self-pay | Admitting: Family Medicine

## 2020-01-28 DIAGNOSIS — R202 Paresthesia of skin: Secondary | ICD-10-CM

## 2020-01-28 MED ORDER — GABAPENTIN 300 MG PO CAPS: ORAL_CAPSULE | ORAL | 2 refills | Status: AC

## 2020-01-28 NOTE — Telephone Encounter (Signed)
Left detailed message with instructions.  Advised to call back if any questions.   Nothing further needed.

## 2020-01-28 NOTE — Telephone Encounter (Signed)
Please call patient and let him know that I have sent in prescription for gabapentin. He will take one capsule at bedtime for 7 days then increase to one capsule twice a day. I have put in neurology referral.

## 2020-01-28 NOTE — Telephone Encounter (Signed)
Pt aware of results.  Requests the referral to Neuro Pt also wants to go ahead and start the Gabapentin treatment as well.  [facility] [address]  Please advise, thanks.

## 2020-02-05 IMAGING — CR CHEST - 2 VIEW
1 series · 2 of 2 positions shown · non-contrast
Comparison: None.

CLINICAL DATA: Generalized body aches, nausea vomiting. Weakness
and cough.

EXAM:
CHEST - 2 VIEW

[Series 1: dg chest 2 view · 0.14mm/px · 2 of 2 slices shown]
[im 1/2]
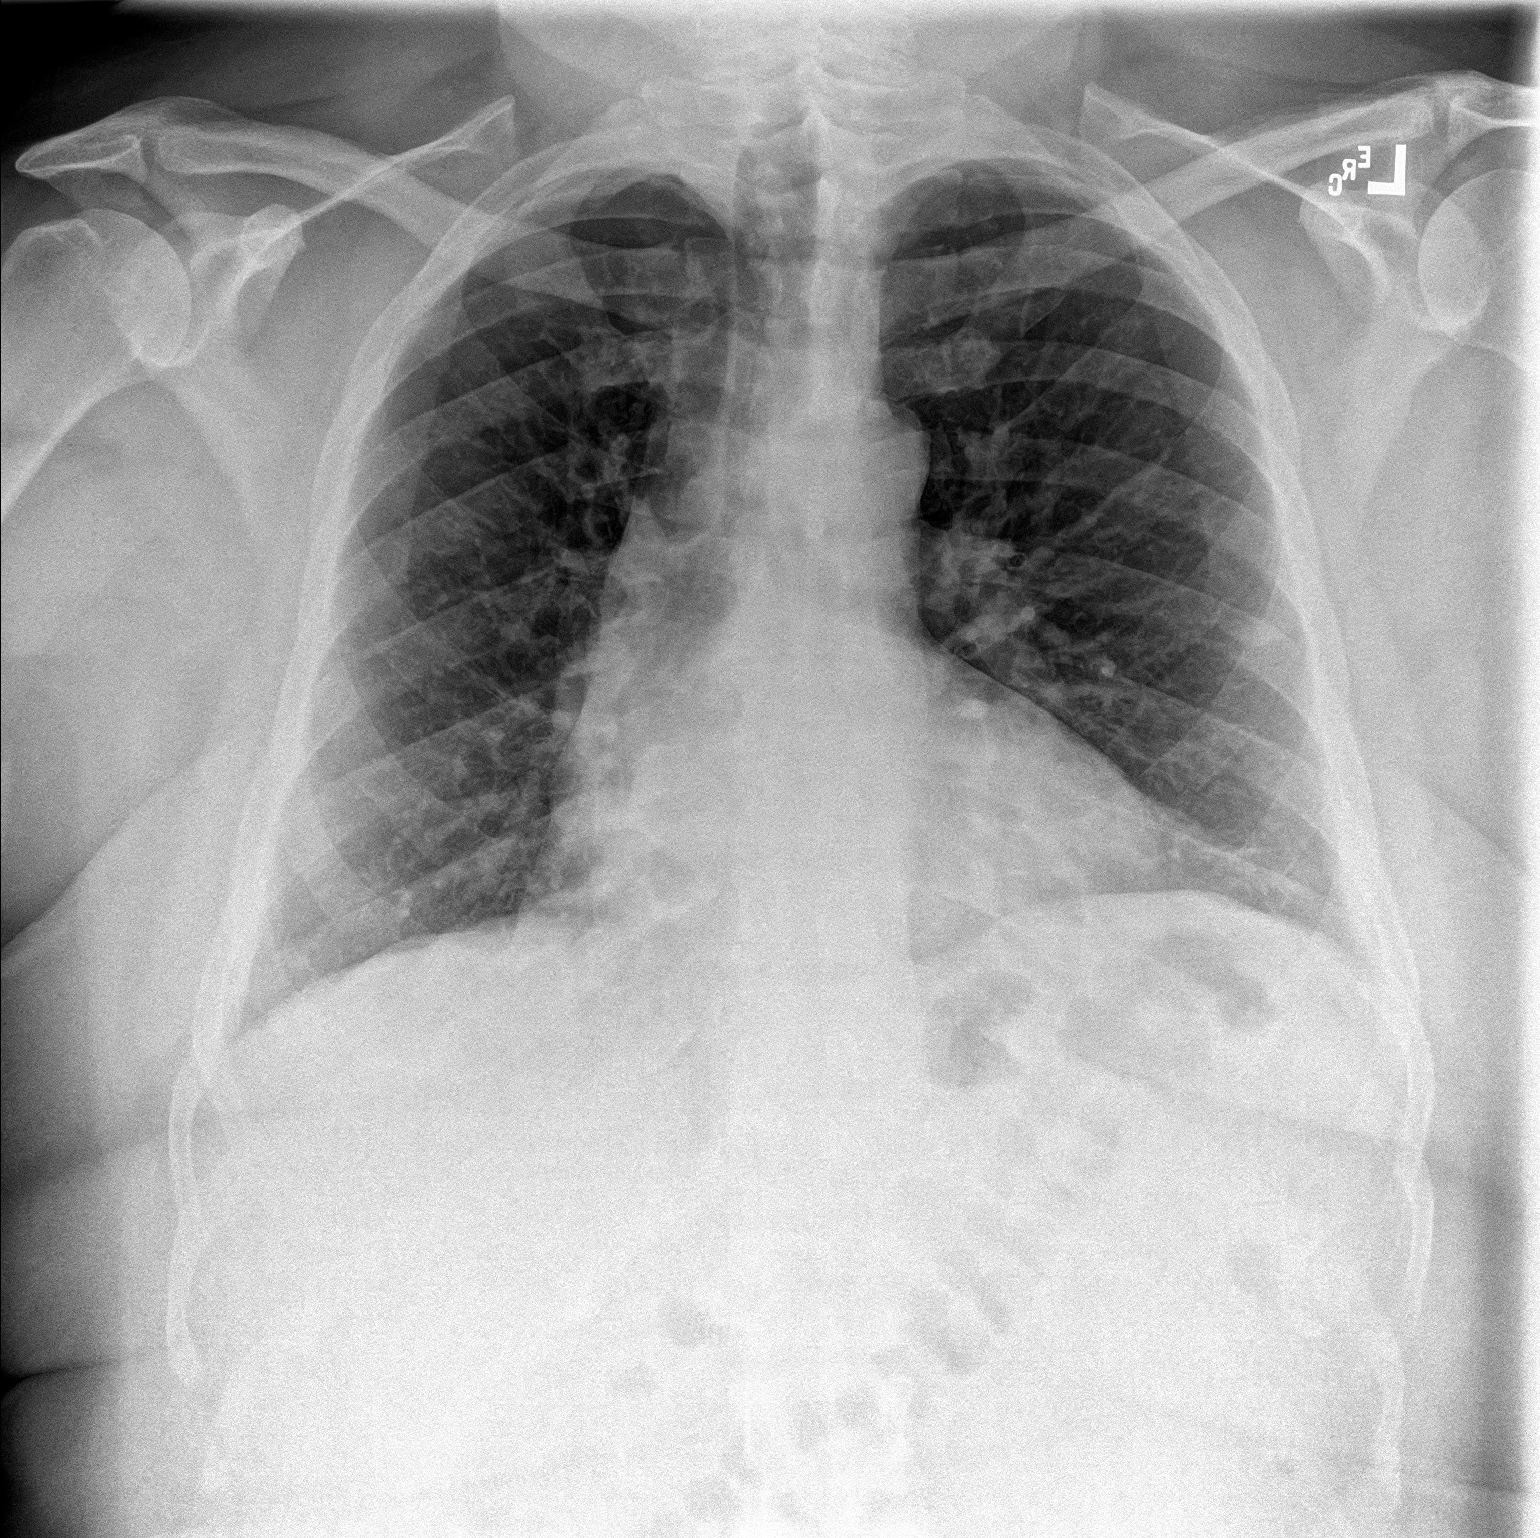
[im 2/2]
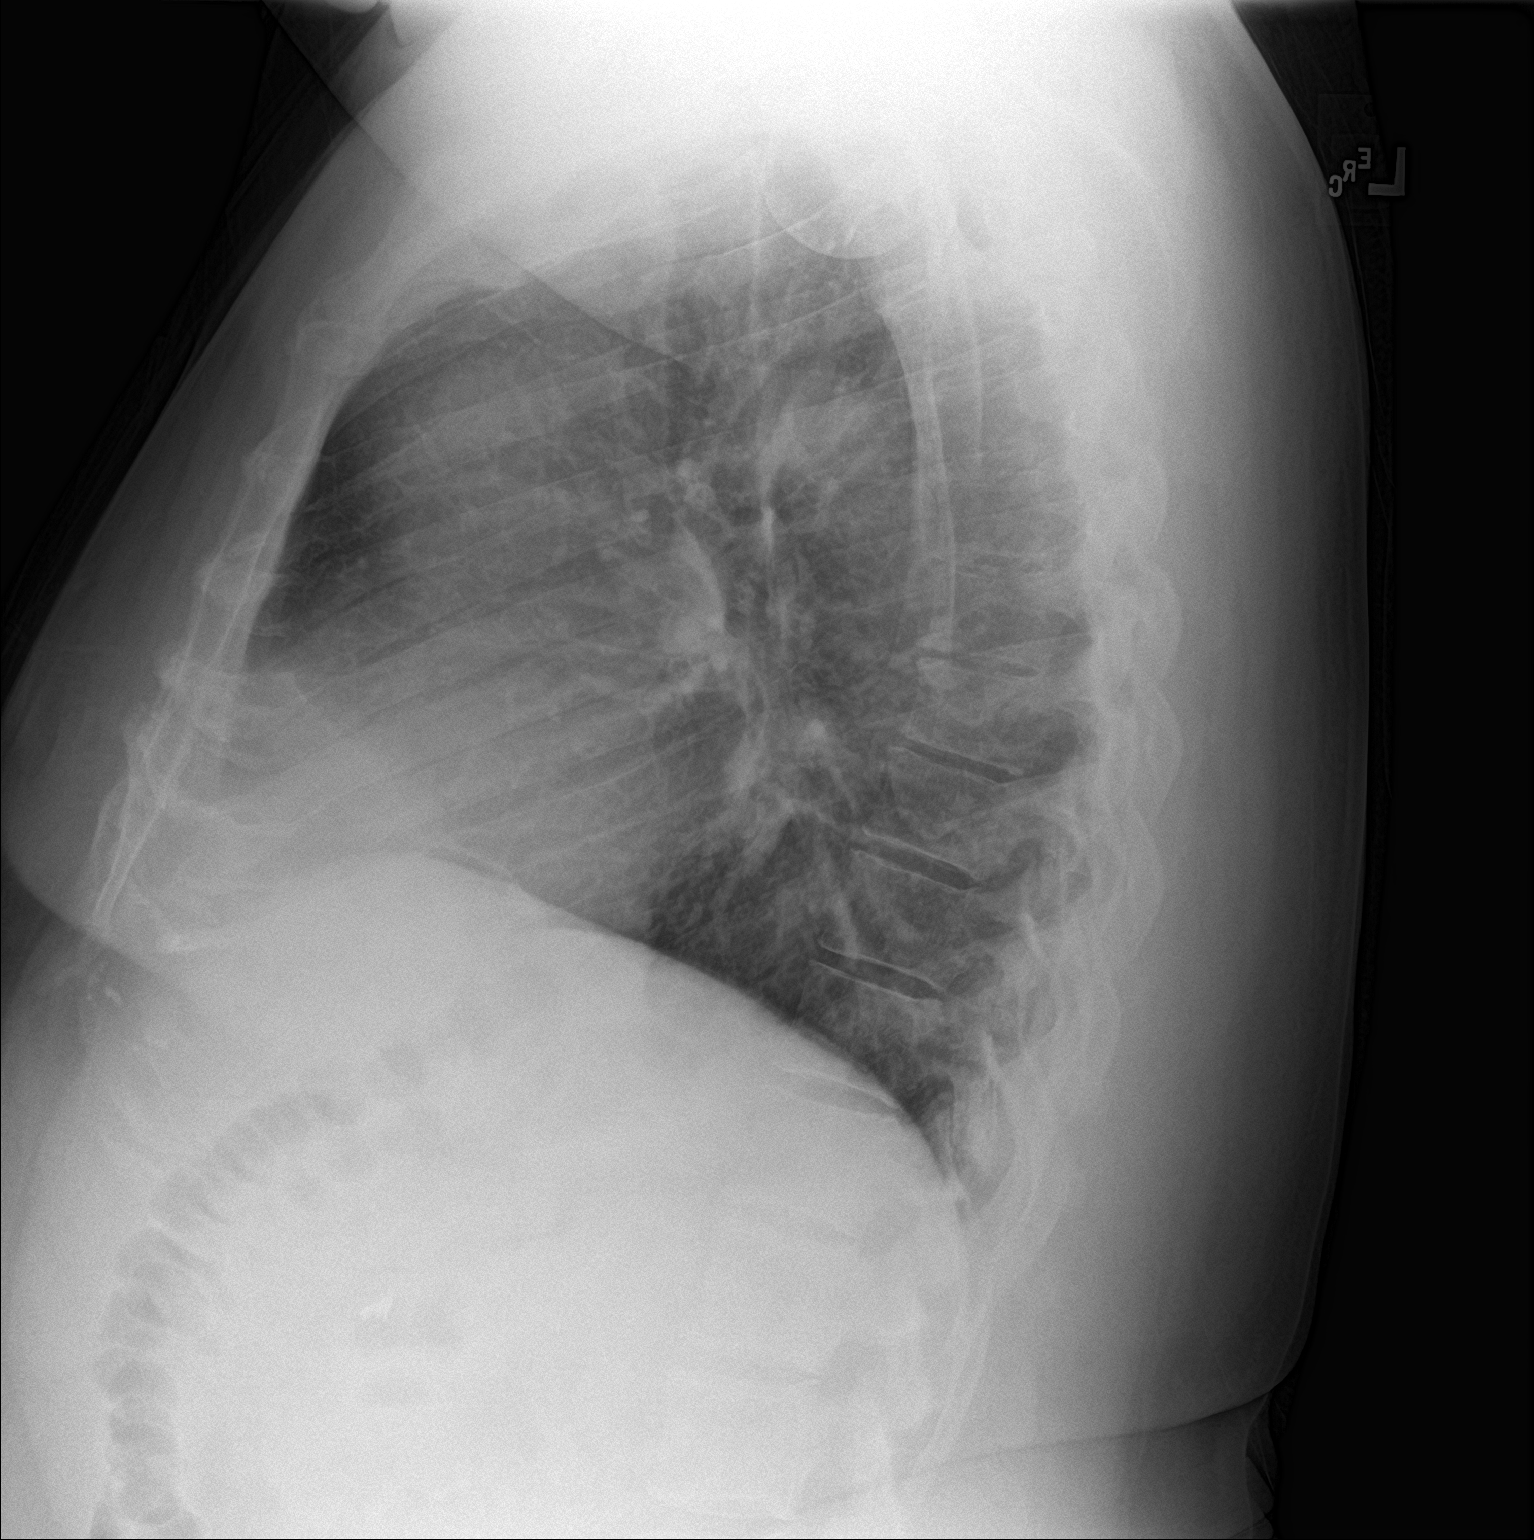

[2 of 2 positions shown; findings below may reference images not displayed]

FINDINGS: Cardiomediastinal silhouette is normal. Mediastinal contours appear
intact.

There is no evidence of focal airspace consolidation, pleural
effusion or pneumothorax. Mild left lung base atelectasis versus
scarring.

Osseous structures are without acute abnormality. Soft tissues are
grossly normal.
IMPRESSION: No active cardiopulmonary disease.

## 2020-02-15 DIAGNOSIS — Z23 Encounter for immunization: Secondary | ICD-10-CM | POA: Diagnosis not present

## 2020-05-22 DIAGNOSIS — S8992XA Unspecified injury of left lower leg, initial encounter: Secondary | ICD-10-CM | POA: Diagnosis not present

## 2020-06-12 DIAGNOSIS — J018 Other acute sinusitis: Secondary | ICD-10-CM | POA: Diagnosis not present

## 2020-06-12 DIAGNOSIS — Z20822 Contact with and (suspected) exposure to covid-19: Secondary | ICD-10-CM | POA: Diagnosis not present

## 2020-06-12 DIAGNOSIS — R519 Headache, unspecified: Secondary | ICD-10-CM | POA: Diagnosis not present

## 2020-06-12 DIAGNOSIS — J328 Other chronic sinusitis: Secondary | ICD-10-CM | POA: Diagnosis not present

## 2020-06-12 DIAGNOSIS — R0989 Other specified symptoms and signs involving the circulatory and respiratory systems: Secondary | ICD-10-CM | POA: Diagnosis not present

## 2020-06-12 DIAGNOSIS — J029 Acute pharyngitis, unspecified: Secondary | ICD-10-CM | POA: Diagnosis not present

## 2020-06-12 DIAGNOSIS — B9789 Other viral agents as the cause of diseases classified elsewhere: Secondary | ICD-10-CM | POA: Diagnosis not present

## 2020-07-16 DIAGNOSIS — S8992XA Unspecified injury of left lower leg, initial encounter: Secondary | ICD-10-CM | POA: Diagnosis not present

## 2020-07-20 DIAGNOSIS — S8002XD Contusion of left knee, subsequent encounter: Secondary | ICD-10-CM | POA: Diagnosis not present

## 2021-01-08 IMAGING — CR DG CHEST 2V
2 series · 2 of 2 positions shown · non-contrast
Comparison: 01/30/2019

CLINICAL DATA: Left-sided back pain 5 days.

EXAM:
CHEST - 2 VIEW

[chest pa]
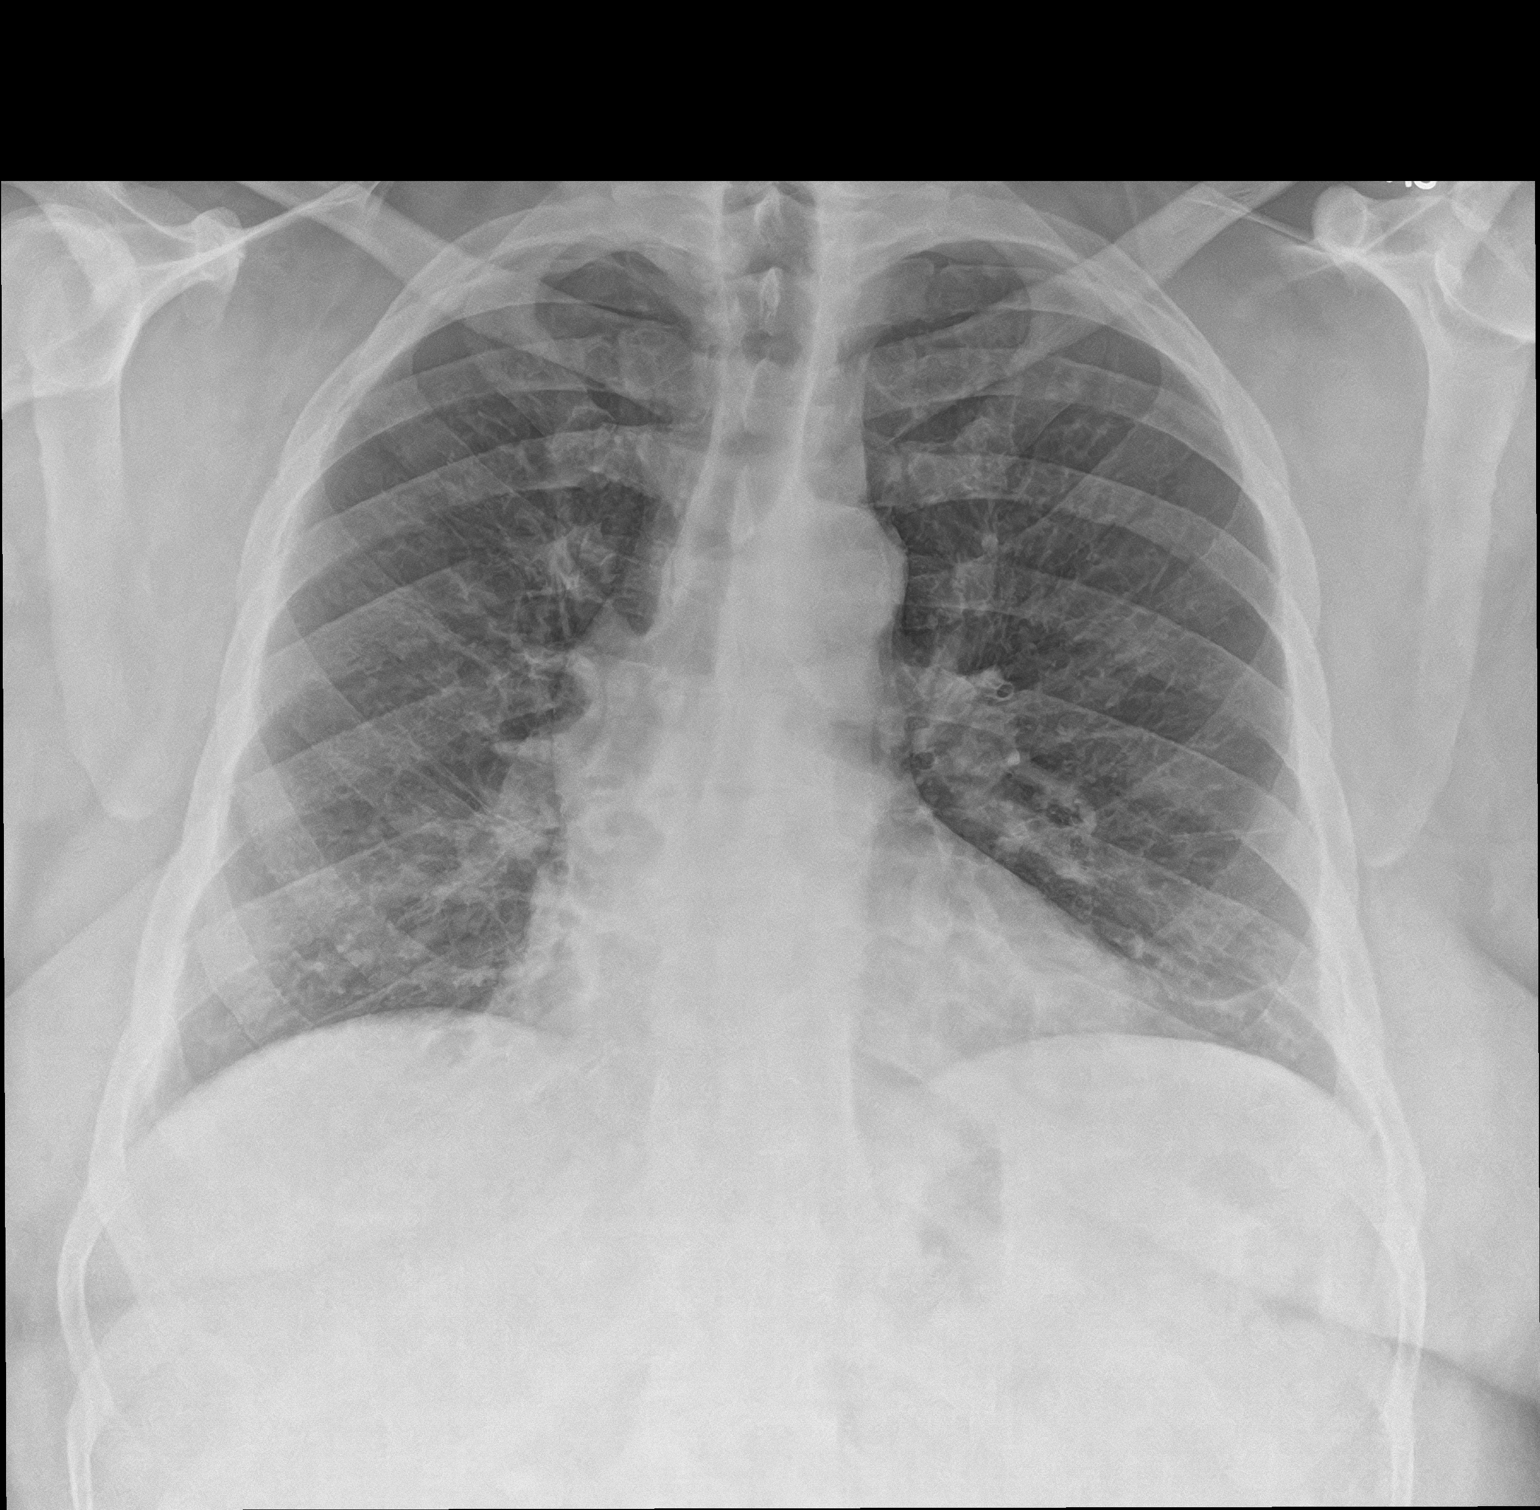

[chest lat]
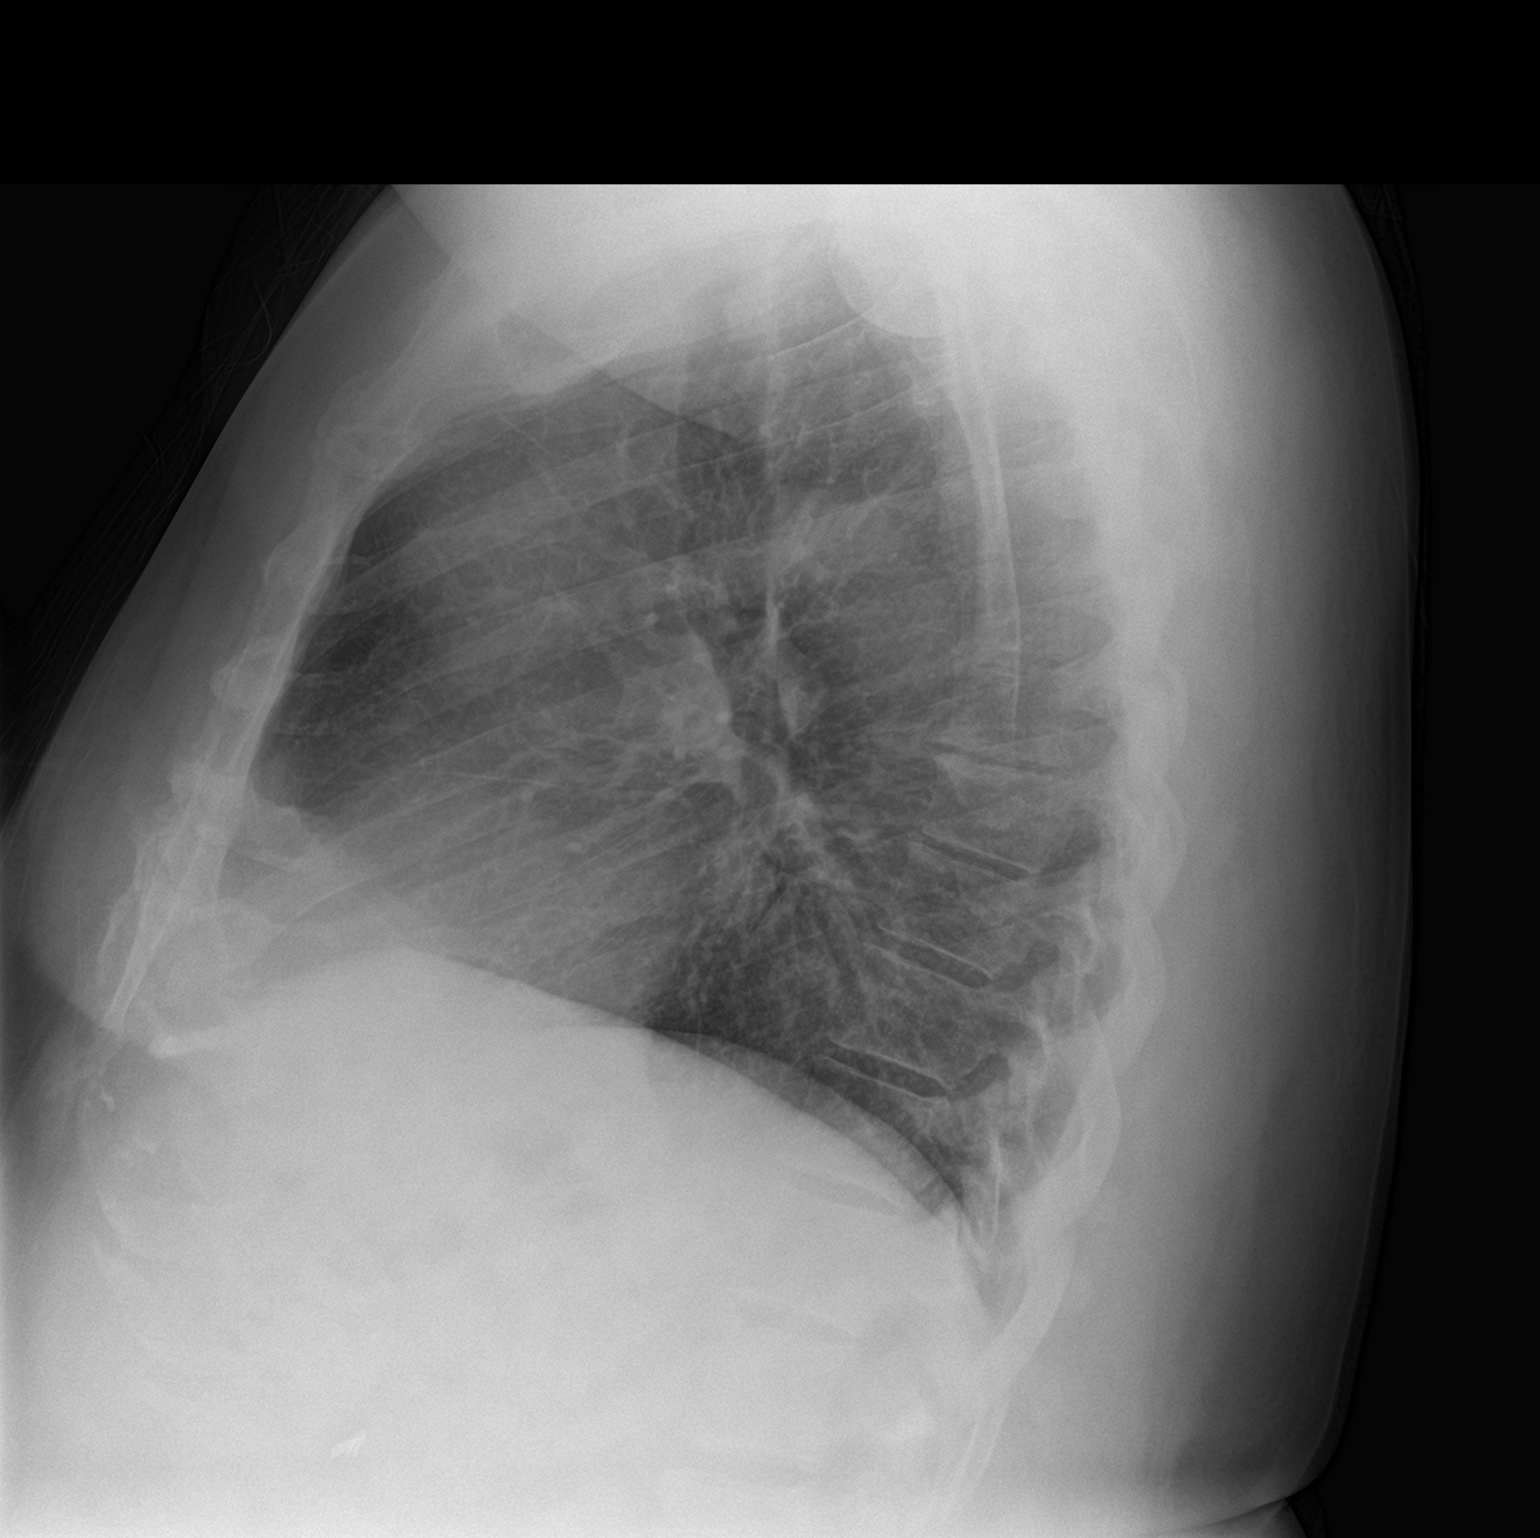

[2 of 2 positions shown; findings below may reference images not displayed]

FINDINGS: Lungs are adequately inflated and otherwise clear. Cardiomediastinal
silhouette and remainder of the exam is unchanged.
IMPRESSION: No active cardiopulmonary disease.

## 2021-01-08 IMAGING — CT CT ANGIO CHEST
2 of 6 series · 18 of 46 positions shown · IV contrast (APPLIED)
Comparison: Chest x-ray today.

CLINICAL DATA: Mid back pain 5 days.

EXAM:
CT ANGIOGRAPHY CHEST WITH CONTRAST
TECHNIQUE: Multidetector CT imaging of the chest was performed using the
standard protocol during bolus administration of intravenous
contrast. Multiplanar CT image reconstructions and MIPs were
obtained to evaluate the vascular anatomy.
CONTRAST:  100mL OMNIPAQUE IOHEXOL 350 MG/ML SOLN

[Series 5: thins · axial · 0.77mm/px · z∈[-96,+174]mm · 15 of 296 slices shown]
[im 13/296  lung]
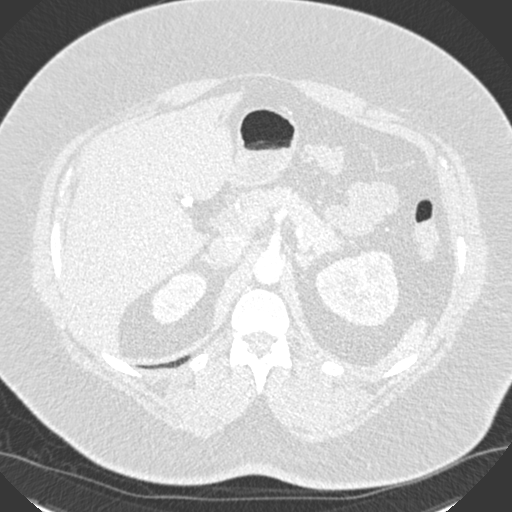
[im 39/296  soft-tissue]
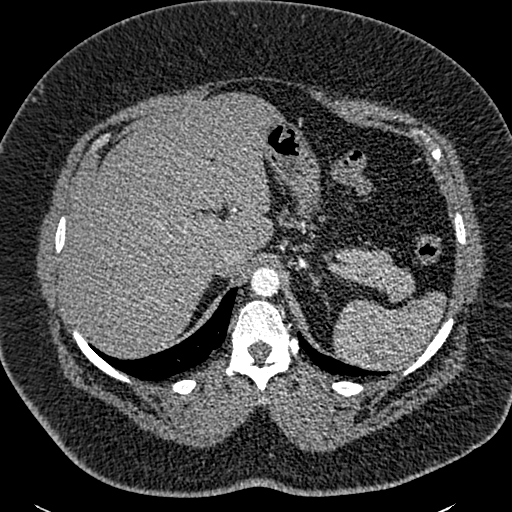
[im 52/296  lung]
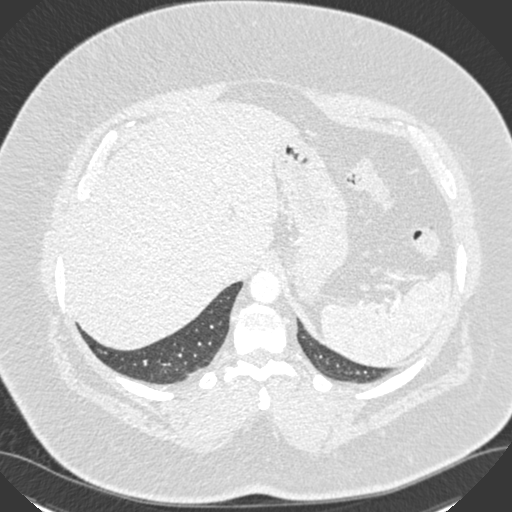
[im 77/296  soft-tissue]
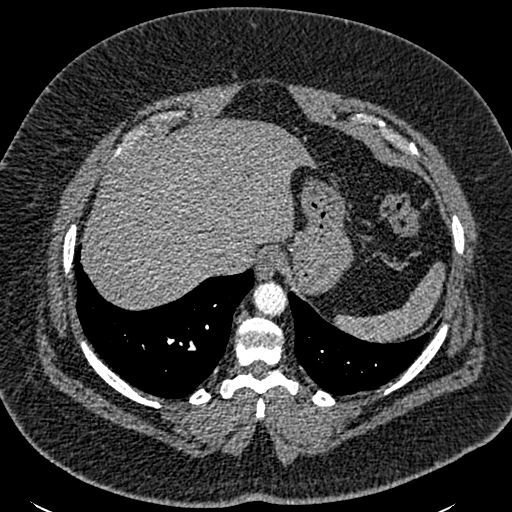
[im 90/296  lung]
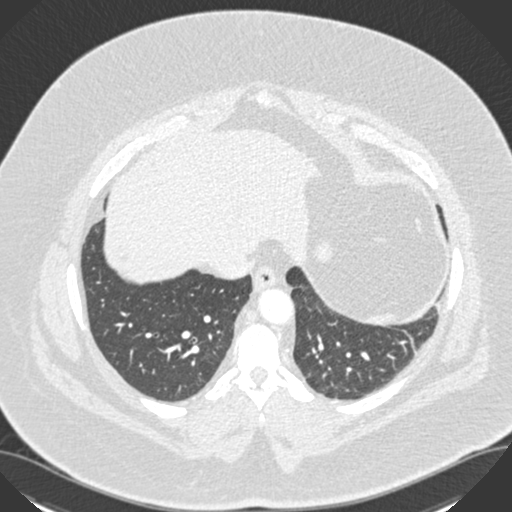
[im 116/296  soft-tissue]
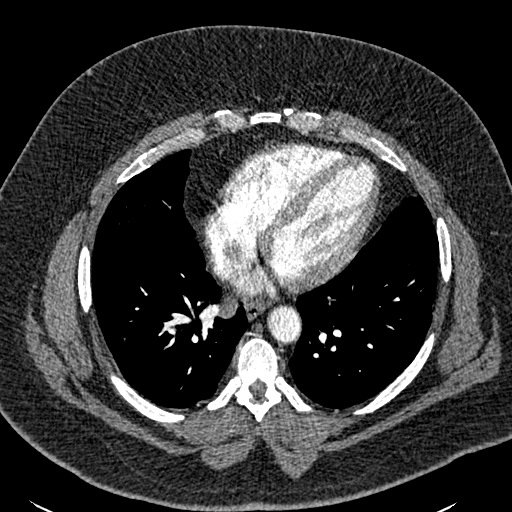
[im 129/296  lung]
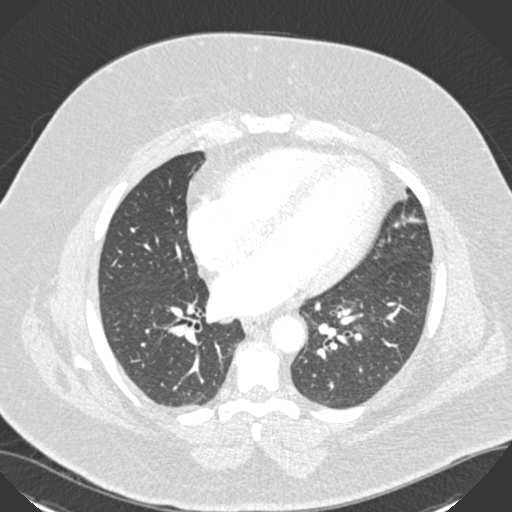
[im 154/296  soft-tissue]
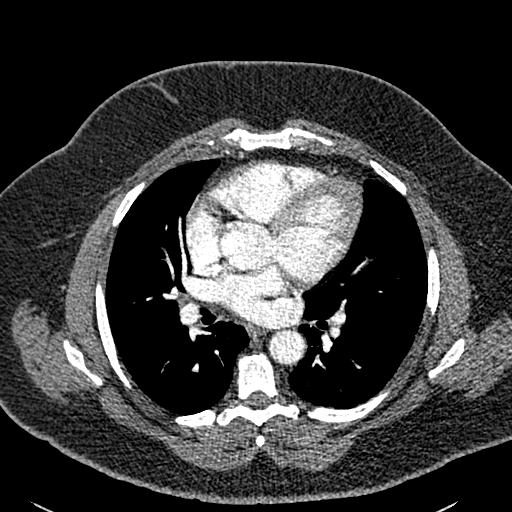
[im 167/296  lung]
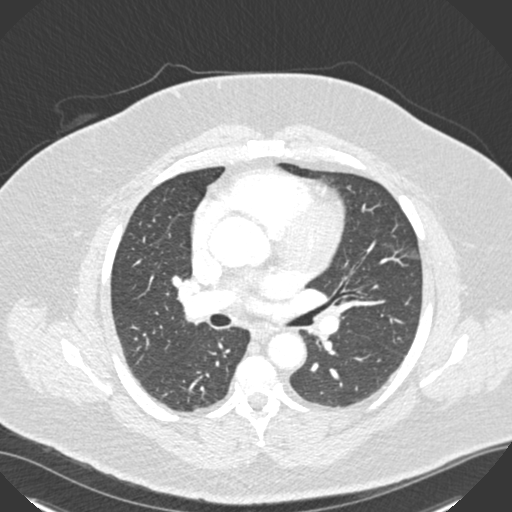
[im 180/296  soft-tissue]
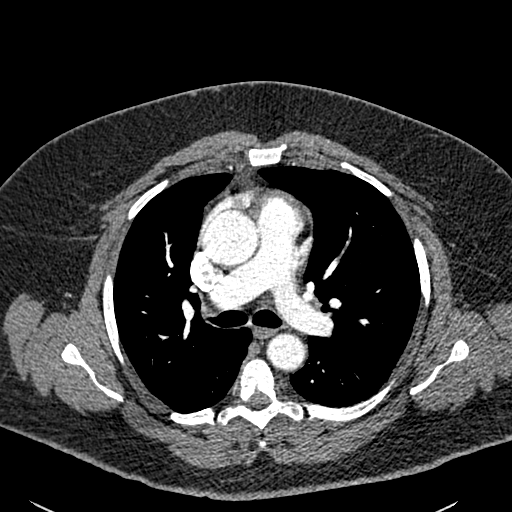
[im 206/296  lung]
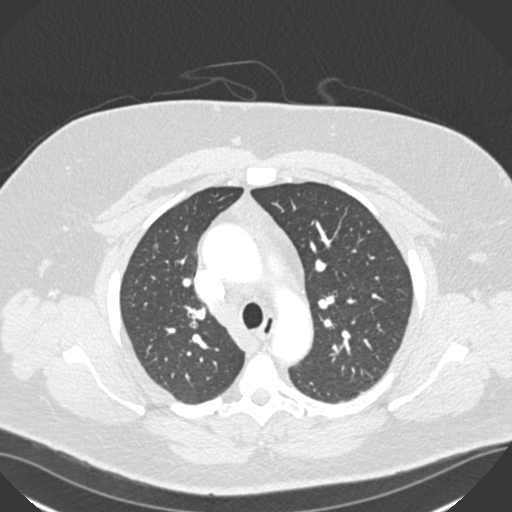
[im 219/296  soft-tissue]
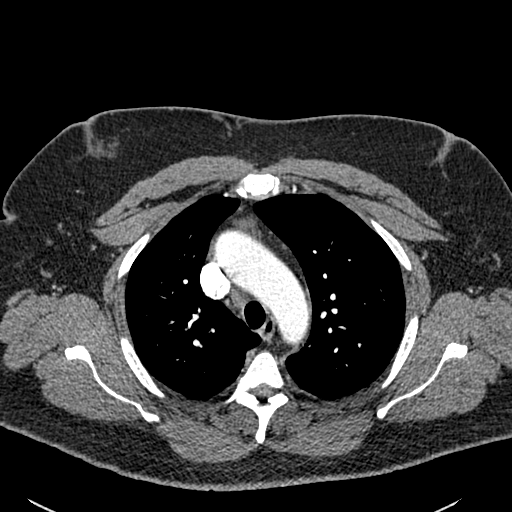
[im 244/296  lung]
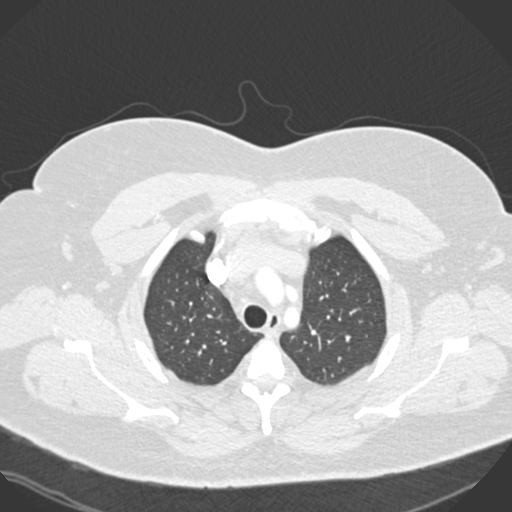
[im 257/296  soft-tissue]
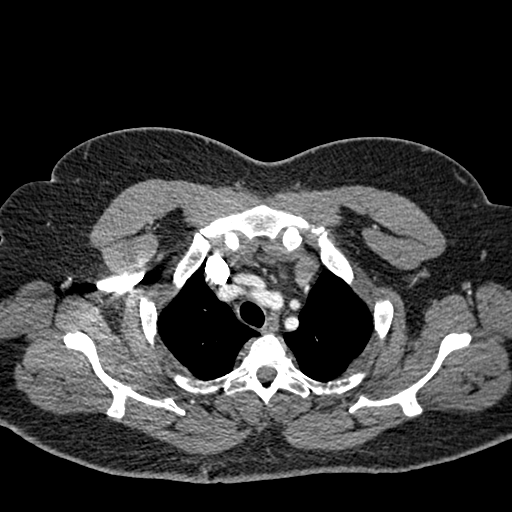
[im 283/296  lung]
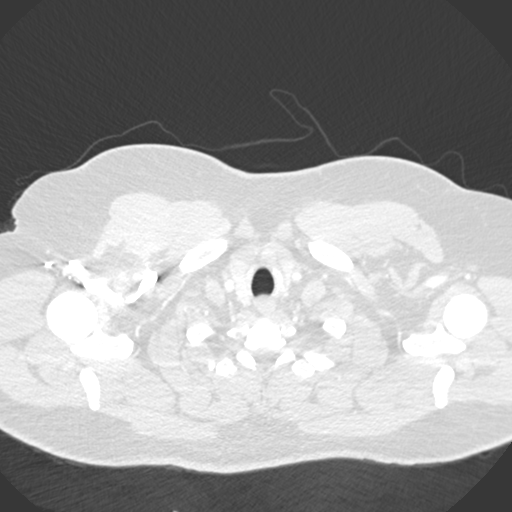

[Series 7: coronal mpr · coronal · 0.58mm/px · 3 of 119 slices shown]
[im 30/119  soft-tissue]
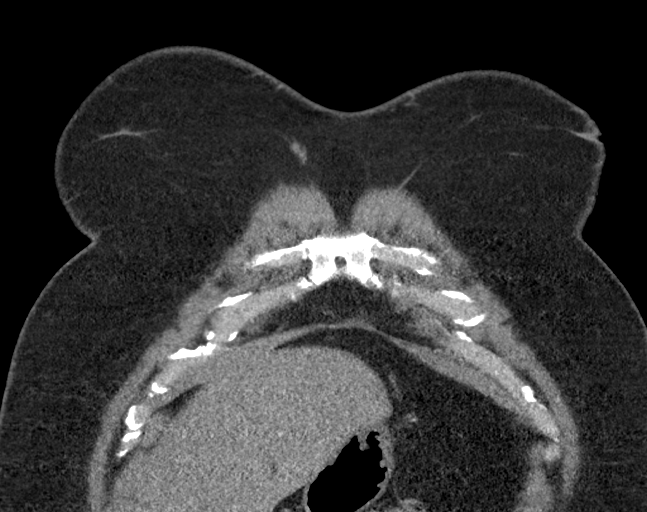
[im 60/119  soft-tissue]
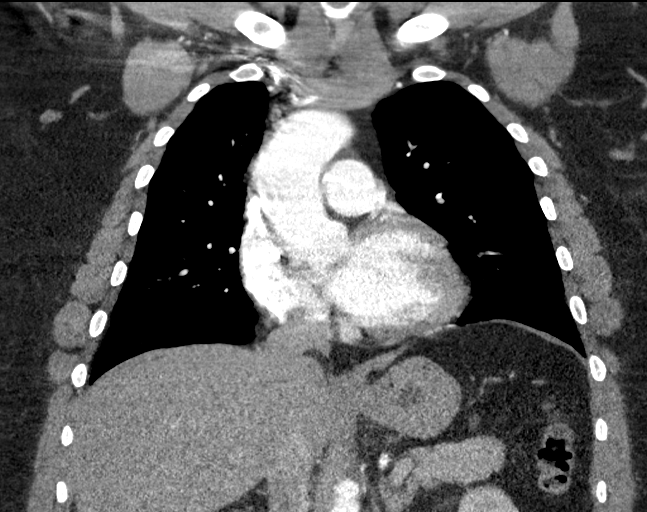
[im 89/119  soft-tissue]
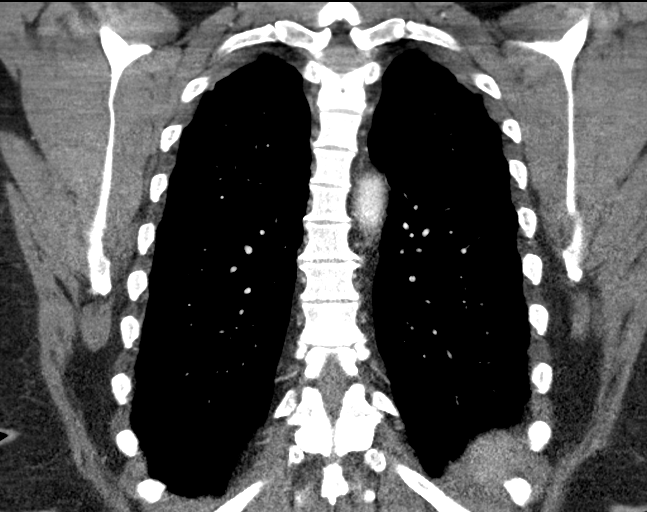

[18 of 46 positions shown; findings below may reference images not displayed]

FINDINGS: Cardiovascular: Heart is normal size. Mild ectasia of the ascending
thoracic aorta measuring 3.5 cm in AP diameter. No evidence of
aortic dissection. Pulmonary arterial system is well opacified
without evidence of emboli.

Mediastinum/Nodes: No evidence of mediastinal or hilar adenopathy.
Remaining mediastinal structures are unremarkable.

Lungs/Pleura: Lungs are adequately inflated without airspace
consolidation or effusion. Minimal linear atelectasis over the
lingula. Airways are normal.

Upper Abdomen: Previous cholecystectomy. No acute findings in the
visualized upper abdomen.

Musculoskeletal: Unremarkable.

Review of the MIP images confirms the above findings.
IMPRESSION: 1. No evidence of pulmonary embolism and no evidence of acute
cardiopulmonary disease.

2. Mild ectasia of the ascending thoracic aorta measuring 3.5 cm.
Recommend annual imaging followup by CTA or MRA. This recommendation
follows 0060 ACCF/AHA/AATS/ACR/ASA/SCA/JADANEC/BIRJILIO/BABU SINGH/NEREIDA Guidelines
for the Diagnosis and Management of Patients with Thoracic Aortic
Disease. Circulation.0060; 121: E266-e369. Aortic aneurysm NOS
([id number]).
